# Patient Record
Sex: Male | Born: 1955 | Race: Black or African American | Hispanic: No | Marital: Single | Smoking: Current some day smoker
Health system: Southern US, Community
[De-identification: ages and names within clinical notes are randomized; demographics above are authoritative.]

## PROBLEM LIST (undated history)

## (undated) DIAGNOSIS — M1990 Unspecified osteoarthritis, unspecified site: Secondary | ICD-10-CM

## (undated) DIAGNOSIS — I10 Essential (primary) hypertension: Secondary | ICD-10-CM

## (undated) DIAGNOSIS — E119 Type 2 diabetes mellitus without complications: Secondary | ICD-10-CM

## (undated) DIAGNOSIS — R011 Cardiac murmur, unspecified: Secondary | ICD-10-CM

## (undated) DIAGNOSIS — G4730 Sleep apnea, unspecified: Secondary | ICD-10-CM

## (undated) DIAGNOSIS — R040 Epistaxis: Secondary | ICD-10-CM

## (undated) HISTORY — PX: NO PAST SURGERIES: SHX2092

---

## 2012-06-25 ENCOUNTER — Ambulatory Visit (HOSPITAL_BASED_OUTPATIENT_CLINIC_OR_DEPARTMENT_OTHER): Payer: Medicare Other | Attending: Internal Medicine | Admitting: Radiology

## 2012-06-25 VITALS — Ht 69.0 in | Wt 203.0 lb

## 2012-06-25 DIAGNOSIS — G4733 Obstructive sleep apnea (adult) (pediatric): Secondary | ICD-10-CM | POA: Insufficient documentation

## 2012-06-28 DIAGNOSIS — R0989 Other specified symptoms and signs involving the circulatory and respiratory systems: Secondary | ICD-10-CM

## 2012-06-28 DIAGNOSIS — R0609 Other forms of dyspnea: Secondary | ICD-10-CM

## 2012-06-28 DIAGNOSIS — G4733 Obstructive sleep apnea (adult) (pediatric): Secondary | ICD-10-CM

## 2012-06-29 NOTE — Procedures (Signed)
NAME:  Donald Wright, Donald Wright NO.:  [id number]  MEDICAL RECORD NO.:  [id number]          PATIENT TYPE:  OUT  LOCATION:  SLEEP CENTER                 FACILITY:  [facility]  PHYSICIAN:  Tanaya Dunigan D. Maple Hudson, MD, FCCP, FACPDATE OF BIRTH:  03[birth year]03[birth year]1957  DATE OF STUDY:  06/25/2012                           NOCTURNAL POLYSOMNOGRAM  REFERRING PHYSICIAN:  Fleet Contras, M.D.  INDICATION FOR STUDY:  Hypersomnia with sleep apnea.  EPWORTH SLEEPINESS SCORE:  16/24.  BMI 30, weight 203 pounds, height 69 inches, neck 17 inches.  MEDICATIONS:  Home medications are charted and reviewed.  SLEEP ARCHITECTURE:  Split study protocol.  During the diagnostic phase, total sleep time 120.5 minutes with sleep efficiency 82.8%.  Stage I was 5%, stage II 93.8%, stage III absent, REM 1.2% of total sleep time. Sleep latency 12 minutes, REM latency 78.5 minutes, awake after sleep onset 13 minutes.  Arousal index 35.4.  BEDTIME MEDICATION:  None.  RESPIRATORY DATA:  Apnea-hypopnea index (AHI) 42.8 per hour wore.  A total of 86 events was scored including 65 obstructive apneas, 2 central apneas, 3 mixed apneas, 16 hypopneas.  Events were not positional.  REM AHI 120 per hour.  CPAP was then titrated to 20 CWP, AHI 0 per hour.  He wore a medium ResMed Quattro FX full-face mask with heated humidifier.  OXYGEN DATA:  Moderately loud snoring with oxygen desaturation to a nadir of 82% and mean oxygen saturation through the study of 92.9% on room air.  CARDIAC DATA:  Sinus rhythm with occasional PVC.  MOVEMENT-PARASOMNIA:  Limb jerks were noted with relatively little impact on sleep.  Bathroom x2.  IMPRESSIONS-RECOMMENDATIONS: 1. Severe obstructive sleep apnea/hypopnea syndrome, AHI 42.8 per hour     with non-positional events, moderately loud snoring and oxygen     desaturation to a nadir of 82% on room air. 2. Successful continuous positive airway pressure titration to 20 CWP,     AHI 0 per hour.  He  wore a medium ResMed Quattro FX full-face mask     with heated humidifier.  Snoring was prevented and mean oxygen     saturation held 96.3% on room air.  Pressures in this range may not be     comfortable with continuous positive     airway pressure, depending on the individual.  It may be necessary     to switch to bilevel (BiPAP) in discussion with the home care     company or consultation if necessary.     Tamerra Merkley D. Maple Hudson, MD, [facility], FACP Diplomate, American Board of Sleep Medicine    CDY/MEDQ  D:  06/28/2012 09:41:00  T:  06/29/2012 04:22:58  Job:  [id number]

## 2019-07-04 ENCOUNTER — Emergency Department (HOSPITAL_COMMUNITY)
Admission: EM | Admit: 2019-07-04 | Discharge: 2019-07-04 | Disposition: A | Discharge status: Home / Self Care | Payer: Medicare Other | Attending: Emergency Medicine | Admitting: Emergency Medicine

## 2019-07-04 ENCOUNTER — Encounter (HOSPITAL_COMMUNITY): Payer: Self-pay | Admitting: Emergency Medicine

## 2019-07-04 ENCOUNTER — Other Ambulatory Visit: Payer: Self-pay

## 2019-07-04 DIAGNOSIS — Z5321 Procedure and treatment not carried out due to patient leaving prior to being seen by health care provider: Secondary | ICD-10-CM | POA: Insufficient documentation

## 2019-07-04 DIAGNOSIS — R109 Unspecified abdominal pain: Secondary | ICD-10-CM | POA: Diagnosis present

## 2019-07-04 HISTORY — DX: Type 2 diabetes mellitus without complications: E11.9

## 2019-07-04 LAB — CBC WITH DIFFERENTIAL/PLATELET
Abs Immature Granulocytes: 0.02 10*3/uL (ref 0.00–0.07)
Basophils Absolute: 0 10*3/uL (ref 0.0–0.1)
Basophils Relative: 0 %
Eosinophils Absolute: 0.2 10*3/uL (ref 0.0–0.5)
Eosinophils Relative: 3 %
HCT: 44.1 % (ref 39.0–52.0)
Hemoglobin: 15 g/dL (ref 13.0–17.0)
Immature Granulocytes: 0 %
Lymphocytes Relative: 33 %
Lymphs Abs: 2.3 10*3/uL (ref 0.7–4.0)
MCH: 30.1 pg (ref 26.0–34.0)
MCHC: 34 g/dL (ref 30.0–36.0)
MCV: 88.4 fL (ref 80.0–100.0)
Monocytes Absolute: 0.5 10*3/uL (ref 0.1–1.0)
Monocytes Relative: 7 %
Neutro Abs: 3.9 10*3/uL (ref 1.7–7.7)
Neutrophils Relative %: 57 %
Platelets: 262 10*3/uL (ref 150–400)
RBC: 4.99 MIL/uL (ref 4.22–5.81)
RDW: 12.4 % (ref 11.5–15.5)
WBC: 6.9 10*3/uL (ref 4.0–10.5)
nRBC: 0 % (ref 0.0–0.2)

## 2019-07-04 LAB — COMPREHENSIVE METABOLIC PANEL
ALT: 25 U/L (ref 0–44)
AST: 24 U/L (ref 15–41)
Albumin: 4 g/dL (ref 3.5–5.0)
Alkaline Phosphatase: 47 U/L (ref 38–126)
Anion gap: 13 (ref 5–15)
BUN: 7 mg/dL — ABNORMAL LOW (ref 8–23)
CO2: 22 mmol/L (ref 22–32)
Calcium: 9.2 mg/dL (ref 8.9–10.3)
Chloride: 104 mmol/L (ref 98–111)
Creatinine, Ser: 1.02 mg/dL (ref 0.61–1.24)
GFR calc Af Amer: >60 mL/min (ref >60)
GFR calc non Af Amer: >60 mL/min (ref >60)
Glucose, Bld: 177 mg/dL — ABNORMAL HIGH (ref 70–99)
Potassium: 3.4 mmol/L — ABNORMAL LOW (ref 3.5–5.1)
Sodium: 139 mmol/L (ref 135–145)
Total Bilirubin: 1.1 mg/dL (ref 0.3–1.2)
Total Protein: 7.3 g/dL (ref 6.5–8.1)

## 2019-07-04 NOTE — ED Notes (Signed)
No answer in waiting room for treatment room. 

## 2019-07-04 NOTE — ED Notes (Signed)
No answer in waiting room 

## 2019-07-04 NOTE — ED Notes (Signed)
No answer x 3 in [facility]

## 2019-07-04 NOTE — ED Triage Notes (Signed)
Pt to ED with c/o right groin pain for over a week.  Pt st's he was seen by his MD last Monday and was put on Cipro. Pt st's the pain has gotten worse.

## 2019-07-05 ENCOUNTER — Emergency Department (HOSPITAL_COMMUNITY)
Admission: EM | Admit: 2019-07-05 | Discharge: 2019-07-05 | Disposition: A | Discharge status: Home / Self Care | Payer: Medicare Other | Attending: Emergency Medicine | Admitting: Emergency Medicine

## 2019-07-05 ENCOUNTER — Encounter (HOSPITAL_COMMUNITY): Payer: Self-pay | Admitting: Emergency Medicine

## 2019-07-05 DIAGNOSIS — R1030 Lower abdominal pain, unspecified: Secondary | ICD-10-CM | POA: Diagnosis present

## 2019-07-05 DIAGNOSIS — Z5321 Procedure and treatment not carried out due to patient leaving prior to being seen by health care provider: Secondary | ICD-10-CM | POA: Insufficient documentation

## 2019-07-05 LAB — CBC WITH DIFFERENTIAL/PLATELET
Abs Immature Granulocytes: 0.02 10*3/uL (ref 0.00–0.07)
Basophils Absolute: 0 10*3/uL (ref 0.0–0.1)
Basophils Relative: 1 %
Eosinophils Absolute: 0.2 10*3/uL (ref 0.0–0.5)
Eosinophils Relative: 3 %
HCT: 43.2 % (ref 39.0–52.0)
Hemoglobin: 14.8 g/dL (ref 13.0–17.0)
Immature Granulocytes: 0 %
Lymphocytes Relative: 31 %
Lymphs Abs: 1.8 10*3/uL (ref 0.7–4.0)
MCH: 30.3 pg (ref 26.0–34.0)
MCHC: 34.3 g/dL (ref 30.0–36.0)
MCV: 88.5 fL (ref 80.0–100.0)
Monocytes Absolute: 0.4 10*3/uL (ref 0.1–1.0)
Monocytes Relative: 7 %
Neutro Abs: 3.6 10*3/uL (ref 1.7–7.7)
Neutrophils Relative %: 58 %
Platelets: 246 10*3/uL (ref 150–400)
RBC: 4.88 MIL/uL (ref 4.22–5.81)
RDW: 12.4 % (ref 11.5–15.5)
WBC: 6 10*3/uL (ref 4.0–10.5)
nRBC: 0 % (ref 0.0–0.2)

## 2019-07-05 LAB — URINALYSIS, ROUTINE W REFLEX MICROSCOPIC
Bacteria, UA: NONE SEEN
Bilirubin Urine: NEGATIVE
Glucose, UA: >=500 mg/dL — AB
Hgb urine dipstick: NEGATIVE
Ketones, ur: NEGATIVE mg/dL
Leukocytes,Ua: NEGATIVE
Nitrite: NEGATIVE
Protein, ur: NEGATIVE mg/dL
Specific Gravity, Urine: 1.013 (ref 1.005–1.030)
pH: 5 (ref 5.0–8.0)

## 2019-07-05 LAB — COMPREHENSIVE METABOLIC PANEL
ALT: 27 U/L (ref 0–44)
AST: 24 U/L (ref 15–41)
Albumin: 4 g/dL (ref 3.5–5.0)
Alkaline Phosphatase: 53 U/L (ref 38–126)
Anion gap: 10 (ref 5–15)
BUN: 5 mg/dL — ABNORMAL LOW (ref 8–23)
CO2: 25 mmol/L (ref 22–32)
Calcium: 9 mg/dL (ref 8.9–10.3)
Chloride: 102 mmol/L (ref 98–111)
Creatinine, Ser: 1 mg/dL (ref 0.61–1.24)
GFR calc Af Amer: >60 mL/min (ref >60)
GFR calc non Af Amer: >60 mL/min (ref >60)
Glucose, Bld: 248 mg/dL — ABNORMAL HIGH (ref 70–99)
Potassium: 3.4 mmol/L — ABNORMAL LOW (ref 3.5–5.1)
Sodium: 137 mmol/L (ref 135–145)
Total Bilirubin: 1 mg/dL (ref 0.3–1.2)
Total Protein: 7.5 g/dL (ref 6.5–8.1)

## 2019-07-05 LAB — PROTIME-INR
INR: 1 (ref 0.8–1.2)
Prothrombin Time: 13.1 seconds (ref 11.4–15.2)

## 2019-07-05 LAB — LIPASE, BLOOD: Lipase: 27 U/L (ref 11–51)

## 2019-07-05 LAB — LACTIC ACID, PLASMA: Lactic Acid, Venous: 2.5 mmol/L (ref 0.5–1.9)

## 2019-07-05 NOTE — ED Triage Notes (Signed)
Pt has 10/10 pain in right groin and his provider is  Concerned for incarcerated hernia. MD pfeiffer has spoken with dr.lamb and ordered labs- will in form charge on pt needing room.

## 2019-07-05 NOTE — ED Provider Notes (Addendum)
Dr. Arline Asp has called the emergency department to notify he is sending patient over for evaluation of possible incarcerated hernia on the right.  Will order initial lab work for evaluation by other provider.   Charlesetta Shanks, MD 07/05/19 1519    Charlesetta Shanks, MD 07/05/19 1520

## 2019-07-05 NOTE — ED Notes (Signed)
Dr Regenia Skeeter notified of lactic of 2.5

## 2019-07-05 NOTE — ED Notes (Signed)
No answer for room x3 

## 2019-07-07 ENCOUNTER — Ambulatory Visit: Payer: Self-pay | Admitting: Surgery

## 2019-07-07 NOTE — H&P (View-Only) (Signed)
Surgical H&P  CC: right groin pain  HPI: This is a very nice 63 year old man with no prior surgical history who has been experiencing severe right groin pain for about 2 weeks.  The pain is burning and sharp and radiates into the medial thigh and groin crease.  He denies any associated nausea, bloating, vomiting or change in bowel habits.  Denies any change in bladder function.  He does notice some swelling in the right groin as well.  He was evaluated by his primary care doctor who diagnosed a hernia and referred him here.  He was initially referred to the emergency room for urgent evaluation, but has gone twice now and do the long wait times has had to leave before being seen.  He does smoke on occasion, in fact had quit briefly started again to help himself get to this pain.  No Known Allergies  Past Medical History:  Diagnosis Date  . Diabetes mellitus without complication ([place])     No past surgical history on file.  No family history on file.  Social History   Socioeconomic History  . Marital status: Single    Spouse name: Not on file  . Number of children: Not on file  . Years of education: Not on file  . Highest education level: Not on file  Occupational History  . Not on file  Social Needs  . Financial resource strain: Not on file  . Food insecurity    Worry: Not on file    Inability: Not on file  . Transportation needs    Medical: Not on file    Non-medical: Not on file  Tobacco Use  . Smoking status: Current Some Day Smoker  . Smokeless tobacco: Never Used  Substance and Sexual Activity  . Alcohol use: Not Currently  . Drug use: Never  . Sexual activity: Not on file  Lifestyle  . Physical activity    Days per week: Not on file    Minutes per session: Not on file  . Stress: Not on file  Relationships  . Social Herbalist on phone: Not on file    Gets together: Not on file    Attends religious service: Not on file    Active member of club or  organization: Not on file    Attends meetings of clubs or organizations: Not on file    Relationship status: Not on file  Other Topics Concern  . Not on file  Social History Narrative  . Not on file    No current outpatient medications on file prior to visit.   No current facility-administered medications on file prior to visit.     Review of Systems: a complete, 10pt review of systems was completed with pertinent positives and negatives as documented in the HPI  Physical Exam: Vitals ([place]; 07/07/2019 11:06 AM) 07/07/2019 11:05 AM Weight: 196.6 lb Height: 69in Body Surface Area: 2.05 m Body Mass Index: 29.03 kg/m  Temp.: 97.96F(Temporal)  Pulse: 97 (Regular)  BP: 154/88 (Sitting, Left Arm, Standard)  Gen: alert and well appearing Eye: extraocular motion intact, no scleral icterus ENT: moist mucus membranes, dentition intact Neck: no mass or thyromegaly Chest: unlabored respirations, symmetrical air entry, clear bilaterally CV: regular rate and rhythm, no pedal edema Abdomen: soft, nontender, nondistended. No mass or organomegaly. Reducible right inguinal hernia is present with Valsalva. Tender along the inguinal canal. There is laxity on the left side but no overt hernia on exam. MSK: strength  symmetrical throughout, no deformity Neuro: grossly intact, normal gait Psych: normal mood and affect, appropriate insight Skin: warm and dry, no rash or lesion on limited exam   CBC Latest Ref Rng & Units 07/05/2019 07/04/2019  WBC 4.0 - 10.5 K/uL 6.0 6.9  Hemoglobin 13.0 - 17.0 g/dL 14.8 15.0  Hematocrit 39.0 - 52.0 % 43.2 44.1  Platelets 150 - 400 K/uL 246 262    CMP Latest Ref Rng & Units 07/05/2019 07/04/2019  Glucose 70 - 99 mg/dL 248(H) 177(H)  BUN 8 - 23 mg/dL 5(L) 7(L)  Creatinine 0.61 - 1.24 mg/dL 1.00 1.02  Sodium 135 - 145 mmol/L 137 139  Potassium 3.5 - 5.1 mmol/L 3.4(L) 3.4(L)  Chloride 98 - 111 mmol/L 102 104  CO2 22 - 32 mmol/L 25 22   Calcium 8.9 - 10.3 mg/dL 9.0 9.2  Total Protein 6.5 - 8.1 g/dL 7.5 7.3  Total Bilirubin 0.3 - 1.2 mg/dL 1.0 1.1  Alkaline Phos 38 - 126 U/L 53 47  AST 15 - 41 U/L 24 24  ALT 0 - 44 U/L 27 25    Lab Results  Component Value Date   INR 1.0 07/05/2019    Imaging: No results found.   A/P: INGUINAL HERNIA, RIGHT (K40.90) Story: Causing significant pain. I recommend proceeding with open right inguinal hernia repair with mesh. We discussed the procedure in detail in the typical postoperative course. Discussed risks of bleeding, infection, pain, scarring, injury to structures in the field including blood vessels, bowel, bladder and nerves as well as a 1% risk of chronic inguinodynia. Discussed risk of hernia recurrence which is increased by the use of nicotine. Advised him to quit smoking. He says that this will be no problem. Questions were answered. We will try to get him scheduled for next week.   Romana Juniper, MD [facility], [place] Pager [phone]

## 2019-07-07 NOTE — H&P (Signed)
Surgical H&P  CC: right groin pain  HPI: This is a very nice 63 year old man with no prior surgical history who has been experiencing severe right groin pain for about 2 weeks.  The pain is burning and sharp and radiates into the medial thigh and groin crease.  He denies any associated nausea, bloating, vomiting or change in bowel habits.  Denies any change in bladder function.  He does notice some swelling in the right groin as well.  He was evaluated by his primary care doctor who diagnosed a hernia and referred him here.  He was initially referred to the emergency room for urgent evaluation, but has gone twice now and do the long wait times has had to leave before being seen.  He does smoke on occasion, in fact had quit briefly started again to help himself get to this pain.  No Known Allergies  Past Medical History:  Diagnosis Date  . Diabetes mellitus without complication ([place])     No past surgical history on file.  No family history on file.  Social History   Socioeconomic History  . Marital status: Single    Spouse name: Not on file  . Number of children: Not on file  . Years of education: Not on file  . Highest education level: Not on file  Occupational History  . Not on file  Social Needs  . Financial resource strain: Not on file  . Food insecurity    Worry: Not on file    Inability: Not on file  . Transportation needs    Medical: Not on file    Non-medical: Not on file  Tobacco Use  . Smoking status: Current Some Day Smoker  . Smokeless tobacco: Never Used  Substance and Sexual Activity  . Alcohol use: Not Currently  . Drug use: Never  . Sexual activity: Not on file  Lifestyle  . Physical activity    Days per week: Not on file    Minutes per session: Not on file  . Stress: Not on file  Relationships  . Social Herbalist on phone: Not on file    Gets together: Not on file    Attends religious service: Not on file    Active member of club or  organization: Not on file    Attends meetings of clubs or organizations: Not on file    Relationship status: Not on file  Other Topics Concern  . Not on file  Social History Narrative  . Not on file    No current outpatient medications on file prior to visit.   No current facility-administered medications on file prior to visit.     Review of Systems: a complete, 10pt review of systems was completed with pertinent positives and negatives as documented in the HPI  Physical Exam: Vitals ([place]; 07/07/2019 11:06 AM) 07/07/2019 11:05 AM Weight: 196.6 lb Height: 69in Body Surface Area: 2.05 m Body Mass Index: 29.03 kg/m  Temp.: 97.20F(Temporal)  Pulse: 97 (Regular)  BP: 154/88 (Sitting, Left Arm, Standard)  Gen: alert and well appearing Eye: extraocular motion intact, no scleral icterus ENT: moist mucus membranes, dentition intact Neck: no mass or thyromegaly Chest: unlabored respirations, symmetrical air entry, clear bilaterally CV: regular rate and rhythm, no pedal edema Abdomen: soft, nontender, nondistended. No mass or organomegaly. Reducible right inguinal hernia is present with Valsalva. Tender along the inguinal canal. There is laxity on the left side but no overt hernia on exam. MSK: strength  symmetrical throughout, no deformity Neuro: grossly intact, normal gait Psych: normal mood and affect, appropriate insight Skin: warm and dry, no rash or lesion on limited exam   CBC Latest Ref Rng & Units 07/05/2019 07/04/2019  WBC 4.0 - 10.5 K/uL 6.0 6.9  Hemoglobin 13.0 - 17.0 g/dL 14.8 15.0  Hematocrit 39.0 - 52.0 % 43.2 44.1  Platelets 150 - 400 K/uL 246 262    CMP Latest Ref Rng & Units 07/05/2019 07/04/2019  Glucose 70 - 99 mg/dL 248(H) 177(H)  BUN 8 - 23 mg/dL 5(L) 7(L)  Creatinine 0.61 - 1.24 mg/dL 1.00 1.02  Sodium 135 - 145 mmol/L 137 139  Potassium 3.5 - 5.1 mmol/L 3.4(L) 3.4(L)  Chloride 98 - 111 mmol/L 102 104  CO2 22 - 32 mmol/L 25 22   Calcium 8.9 - 10.3 mg/dL 9.0 9.2  Total Protein 6.5 - 8.1 g/dL 7.5 7.3  Total Bilirubin 0.3 - 1.2 mg/dL 1.0 1.1  Alkaline Phos 38 - 126 U/L 53 47  AST 15 - 41 U/L 24 24  ALT 0 - 44 U/L 27 25    Lab Results  Component Value Date   INR 1.0 07/05/2019    Imaging: No results found.   A/P: INGUINAL HERNIA, RIGHT (K40.90) Story: Causing significant pain. I recommend proceeding with open right inguinal hernia repair with mesh. We discussed the procedure in detail in the typical postoperative course. Discussed risks of bleeding, infection, pain, scarring, injury to structures in the field including blood vessels, bowel, bladder and nerves as well as a 1% risk of chronic inguinodynia. Discussed risk of hernia recurrence which is increased by the use of nicotine. Advised him to quit smoking. He says that this will be no problem. Questions were answered. We will try to get him scheduled for next week.   Romana Juniper, MD [facility] Surgery, [place] Pager [phone]

## 2019-07-12 NOTE — Progress Notes (Signed)
[facility] [place], [place] AT [address] [place] [address] Phone: [phone] Fax: [phone]      Your procedure is scheduled on August 28  Report to [facility] Main Entrance "A" at 0530 A.M., and check in at the Admitting office.  Call this number if you have problems the morning of surgery:  [phone]  Call [phone] if you have any questions prior to your surgery date Monday-Friday 8am-4pm    Remember:  Do not eat or drink after midnight the night before your surgery    Take these medicines the morning of surgery with A SIP OF WATER  HYDROcodone-acetaminophen ([place]) if needed for pain metoprolol succinate (TOPROL-XL)  7 days prior to surgery STOP taking any Aspirin (unless otherwise instructed by your surgeon), Aleve, Naproxen, Ibuprofen, Motrin, Advil, Goody's, BC's, all herbal medications, fish oil, and all vitamins.   WHAT DO I DO ABOUT MY DIABETES MEDICATION?   Mar[place] Kitchen Do not take oral diabetes medicines (pills) the morning of surgery. metFORMIN (GLUCOPHAGE)  How to Manage Your Diabetes Before and After Surgery  Why is it important to control my blood sugar before and after surgery? . Improving blood sugar levels before and after surgery helps healing and can limit problems. . A way of improving blood sugar control is eating a healthy diet by: o  Eating less sugar and carbohydrates o  Increasing activity/exercise o  Talking with your doctor about reaching your blood sugar goals . High blood sugars (greater than 180 mg/dL) can raise your risk of infections and slow your recovery, so you will need to focus on controlling your diabetes during the weeks before surgery. . Make sure that the doctor who takes care of your diabetes knows about your planned surgery including the date and location.  How do I manage my blood sugar before surgery? . Check your blood sugar at least 4  times a day, starting 2 days before surgery, to make sure that the level is not too high or low. o Check your blood sugar the morning of your surgery when you wake up and every 2 hours until you get to the Short Stay unit. . If your blood sugar is less than 70 mg/dL, you will need to treat for low blood sugar: o Do not take insulin. o Treat a low blood sugar (less than 70 mg/dL) with  cup of clear juice (cranberry or apple), 4 glucose tablets, OR glucose gel. o Recheck blood sugar in 15 minutes after treatment (to make sure it is greater than 70 mg/dL). If your blood sugar is not greater than 70 mg/dL on recheck, call [phone] for further instructions. . Report your blood sugar to the short stay nurse when you get to Short Stay.  . If you are admitted to the hospital after surgery: o Your blood sugar will be checked by the staff and you will probably be given insulin after surgery (instead of oral diabetes medicines) to make sure you have good blood sugar levels. o The goal for blood sugar control after surgery is 80-180 mg/dL   The Morning of Surgery  Do not wear jewelry  Do not wear lotions, powders, or colognes, or deodorant  Men may shave face and neck.  Do not bring valuables to the hospital.  [facility] is not responsible for any belongings or valuables.  If you are a smoker, DO NOT Smoke 24 hours prior to  surgery IF you wear a CPAP at night please bring your mask, tubing, and machine the morning of surgery   Remember that you must have someone to transport you home after your surgery, and remain with you for 24 hours if you are discharged the same day.   Contacts, glasses, hearing aids, dentures or bridgework may not be worn into surgery.    Leave your suitcase in the car.  After surgery it may be brought to your room.  For patients admitted to the hospital, discharge time will be determined by your treatment team.  Patients discharged the day of surgery will not be  allowed to drive home.    Special instructions:   [facility]- Preparing For Surgery  Before surgery, you can play an important role. Because skin is not sterile, your skin needs to be as free of germs as possible. You can reduce the number of germs on your skin by washing with CHG (chlorahexidine gluconate) Soap before surgery.  CHG is an antiseptic cleaner which kills germs and bonds with the skin to continue killing germs even after washing.    Oral Hygiene is also important to reduce your risk of infection.  Remember - BRUSH YOUR TEETH THE MORNING OF SURGERY WITH YOUR REGULAR TOOTHPASTE  Please do not use if you have an allergy to CHG or antibacterial soaps. If your skin becomes reddened/irritated stop using the CHG.  Do not shave (including legs and underarms) for at least 48 hours prior to first CHG shower. It is OK to shave your face.  Please follow these instructions carefully.   1. Shower the NIGHT BEFORE SURGERY and the MORNING OF SURGERY with CHG Soap.   2. If you chose to wash your hair, wash your hair first as usual with your normal shampoo.  3. After you shampoo, rinse your hair and body thoroughly to remove the shampoo.  4. Use CHG as you would any other liquid soap. You can apply CHG directly to the skin and wash gently with a scrungie or a clean washcloth.   5. Apply the CHG Soap to your body ONLY FROM THE NECK DOWN.  Do not use on open wounds or open sores. Avoid contact with your eyes, ears, mouth and genitals (private parts). Wash Face and genitals (private parts)  with your normal soap.   6. Wash thoroughly, paying special attention to the area where your surgery will be performed.  7. Thoroughly rinse your body with warm water from the neck down.  8. DO NOT shower/wash with your normal soap after using and rinsing off the CHG Soap.  9. Pat yourself dry with a CLEAN TOWEL.  10. Wear CLEAN PAJAMAS to bed the night before surgery, wear comfortable clothes the  morning of surgery  11. Place CLEAN SHEETS on your bed the night of your first shower and DO NOT SLEEP WITH PETS.    Day of Surgery:  Do not apply any deodorants/lotions. Please shower the morning of surgery with the CHG soap  Please wear clean clothes to the hospital/surgery center.   Remember to brush your teeth WITH YOUR REGULAR TOOTHPASTE.   Please read over the following fact sheets that you were given.

## 2019-07-13 ENCOUNTER — Encounter (HOSPITAL_COMMUNITY)
Admission: RE | Admit: 2019-07-13 | Discharge: 2019-07-13 | Disposition: A | Discharge status: Home / Self Care | Payer: Medicare Other | Source: Ambulatory Visit | Attending: Surgery | Admitting: Surgery

## 2019-07-13 ENCOUNTER — Other Ambulatory Visit (HOSPITAL_COMMUNITY)
Admission: RE | Admit: 2019-07-13 | Discharge: 2019-07-13 | Disposition: A | Discharge status: Home / Self Care | Payer: Medicare Other | Source: Ambulatory Visit | Attending: Surgery | Admitting: Surgery

## 2019-07-13 ENCOUNTER — Other Ambulatory Visit: Payer: Self-pay

## 2019-07-13 ENCOUNTER — Other Ambulatory Visit (HOSPITAL_COMMUNITY): Payer: Medicare Other

## 2019-07-13 ENCOUNTER — Encounter (HOSPITAL_COMMUNITY): Payer: Self-pay

## 2019-07-13 DIAGNOSIS — Z1159 Encounter for screening for other viral diseases: Secondary | ICD-10-CM | POA: Insufficient documentation

## 2019-07-13 DIAGNOSIS — E119 Type 2 diabetes mellitus without complications: Secondary | ICD-10-CM | POA: Insufficient documentation

## 2019-07-13 DIAGNOSIS — F17210 Nicotine dependence, cigarettes, uncomplicated: Secondary | ICD-10-CM | POA: Insufficient documentation

## 2019-07-13 DIAGNOSIS — Z01818 Encounter for other preprocedural examination: Secondary | ICD-10-CM | POA: Insufficient documentation

## 2019-07-13 DIAGNOSIS — K4090 Unilateral inguinal hernia, without obstruction or gangrene, not specified as recurrent: Secondary | ICD-10-CM | POA: Insufficient documentation

## 2019-07-13 HISTORY — DX: Epistaxis: R04.0

## 2019-07-13 HISTORY — DX: Essential (primary) hypertension: I10

## 2019-07-13 HISTORY — DX: Unspecified osteoarthritis, unspecified site: M19.90

## 2019-07-13 HISTORY — DX: Cardiac murmur, unspecified: R01.1

## 2019-07-13 HISTORY — DX: Sleep apnea, unspecified: G47.30

## 2019-07-13 LAB — HEMOGLOBIN A1C
Hgb A1c MFr Bld: 8.9 % — ABNORMAL HIGH (ref 4.8–5.6)
Mean Plasma Glucose: 208.73 mg/dL

## 2019-07-13 LAB — SARS CORONAVIRUS 2 (TAT 6-24 HRS): SARS Coronavirus 2: NEGATIVE

## 2019-07-13 LAB — GLUCOSE, CAPILLARY: Glucose-Capillary: 160 mg/dL — ABNORMAL HIGH (ref 70–99)

## 2019-07-13 NOTE — Progress Notes (Signed)
PCP - A. Lamb Cardiologist - several yrs. Ago in [place], told that the chest pain that he was having was "bone pain", unsure of the name of the cardiologist  Chest x-ray - n/a EKG - today Stress Test - pt. Reports that he had a nuclear stress test (5 yrs. Ago or more)  & was unsure of the results. ECHO - n/a Cardiac Cath - n/a  Sleep Study - last study done in [place] CPAP - pt. States he asked for new hose & mask & they never brought it to him so he asked them to remove the CPAP machine from him home, Pt. Has not used CPAP since moving to [place] in 11/2018  Fasting Blood Sugar -160  Checks Blood Sugar ___0__ times a day, recently reports that he wsa started on Metformin, does not have CBG metere Blood Thinner Instructions:n/a Aspirin Instructions:n/a cautioned against all blood thinners Anesthesia review: pending labs- HGBA1C  Patient denies shortness of breath, fever, cough and chest pain at PAT appointment   Patient verbalized understanding of instructions that were given to them at the PAT appointment. Patient was also instructed that they will need to review over the PAT instructions again at home before surgery.

## 2019-07-13 NOTE — Progress Notes (Signed)
   07/13/19 1122  OBSTRUCTIVE SLEEP APNEA  Have you ever been diagnosed with sleep apnea through a sleep study? Yes  If yes, do you have and use a CPAP or BPAP machine every night? 0  Do you snore loudly (loud enough to be heard through closed doors)?  1  Do you often feel tired, fatigued, or sleepy during the daytime (such as falling asleep during driving or talking to someone)? 1  Has anyone observed you stop breathing during your sleep? 1  Do you have, or are you being treated for high blood pressure? 1  BMI more than 35 kg/m2? 0  Age > 50 (1-yes) 1  Neck circumference greater than:Male 16 inches or larger, Male 17inches or larger? 0  Male Gender (Yes=1) 1  Obstructive Sleep Apnea Score 6  Score 5 or greater  Results sent to PCP

## 2019-07-14 NOTE — Progress Notes (Signed)
Anesthesia Chart Review:  Case: [id number] Date/Time: 07/16/19 0715   Procedure: OPEN RIGHT INGUINAL HERNIA REPAIR (Right )   Anesthesia type: General   Pre-op diagnosis: RIGHT INGUINAL HERNIA   Location: [facility] OR ROOM 01 / [facility]   Surgeon: Clovis Riley, MD      DISCUSSION: Patient is a 63 year old male scheduled for the above procedure. He was sent to ED for possible incarcerated right inguinal hernia on 07/05/19, but left without being seen. Lactic acid 2.5. He was then evaluated by Dr. Kae Heller on 07/07/19 and the above procedure recommended. Hernia was tender, but reducible by Valsalva at that time.   History includes smoking, DM2, HTN, left epistaxis, OSA (severe, 2013; hasn't used CPAP since moving back to [facility] 11/2018), murmur (> 10 years ago; no mention recently). He reported use of marijuana and also used "crack" cocaine a week ago. Says he does not regularly use cocaine and only used a week ago desire to self medicate for pain. He knows to refrain for upcoming surgery and UDS would be considered if any concern for acute intoxication.    He moved back to [facility] 11/2018, after being away for 8 years.   In regards to cardiac status, he reports at least 2 prior stress tests for what sound like diffuse bone pain including reproducible chest tenderness. He reports he has always been told his heart checks out okay. Last stress test probably > 5 years ago. He is not quite sure if he ever had an echo. Reports a provider mentioned a murmur > 10 years ago, but no recent mention of a murmur and has never been told he needs on-going cardiology follow-up. No exertional chest pain, SOB, orthopnea, or pedal edema. He occasionally gets right arm cramping with shaving and RLE cramps at night. Over the last few months he has not been as active, but prior to COVID-19 pandemic said he was able to walk for "miles" without difficultly. He does not check home CBGs. He was informed of A1c result of 8.9% and need for PCP  follow-up--says he has visit next month. I also updated [facility] triage nurse regarding A1c result and that patient had been advised to refrain from illicit drug use with upcoming surgery.  Anesthesia team to evaluate on the day of surgery. 07/13/19 COVID-19 test negative.     VS: BP (!) 143/84   Pulse 74   Temp 36.5 C   Resp 18   Ht 5\' 9"  (1.753 m)   SpO2 95%   BMI 28.65 kg/m   PROVIDERS: Rocco Serene, MD is PCP at [facility] [phone])   LABS: Preoperative labs noted. CBG on the day of surgery. (all labs ordered are listed, but only abnormal results are displayed)  Labs Reviewed  GLUCOSE, CAPILLARY - Abnormal; Notable for the following components:      Result Value   Glucose-Capillary 160 (*)    All other components within normal limits  HEMOGLOBIN A1C - Abnormal; Notable for the following components:   Hgb A1c MFr Bld 8.9 (*)    All other components within normal limits  CBC WITH DIFFERENTIAL/PLATELET    OTHER: - 06/29/12 sleep study showed severe OSA/hypopnea syndrome, AHI 42.8 per hour with non-positional events, moderately loud snoring and O2 desaturations to a nadir of 82% on RA.   EKG: 07/13/19:  Normal sinus rhythm Minimal voltage criteria for LVH, may be normal variant Nonspecific T wave abnormality Abnormal ECG Confirmed by Kate Sable [phone])  on 07/13/2019 1:08:29 PM   CV: Reported stress test > 5 years ago in [place], [place]. Unsure about echo. See DISCUSSION.    Past Medical History:  Diagnosis Date  . Arthritis    "groin" arthritis , back & R knee  . Bleeding from the nose    last one- 07/04/2019, pt. reports that he has been to ENT & he was told that there wasn't a problem so he said he would not pay her.   . Diabetes mellitus without complication ([place])   . Heart murmur    pt. thinks he had an ECHO many yrs. ago, doesn't have the results.   . Hypertension   . Sleep apnea    see notes under apnea     Past Surgical History:   Procedure Laterality Date  . NO PAST SURGERIES      MEDICATIONS: . cholecalciferol (VITAMIN D3) 25 MCG (1000 UT) tablet  . diphenhydramine-acetaminophen (TYLENOL PM) 25-500 MG TABS tablet  . GINSENG PO  . HYDROcodone-acetaminophen (NORCO) 10-325 MG tablet  . lisinopril (ZESTRIL) 40 MG tablet  . metFORMIN (GLUCOPHAGE) 500 MG tablet  . metoprolol succinate (TOPROL-XL) 50 MG 24 hr tablet  . vitamin E 400 UNIT capsule   No current facility-administered medications for this encounter.     Myra Gianotti, PA-C Surgical Short Stay/Anesthesiology [facility] Phone [phone] [facility] Phone [phone] 07/14/2019 4:16 PM

## 2019-07-14 NOTE — Anesthesia Preprocedure Evaluation (Addendum)
Anesthesia Evaluation  Patient identified by MRN, date of birth, ID band Patient awake    Reviewed: Allergy & Precautions, NPO status , Patient's Chart, lab work & pertinent test results  History of Anesthesia Complications Negative for: history of anesthetic complications  Airway Mallampati: II  TM Distance: >3 FB Neck ROM: Full    Dental no notable dental hx. (+) Edentulous Upper, Poor Dentition,    Pulmonary neg pulmonary ROS, sleep apnea , Current Smoker and Patient abstained from smoking.,    Pulmonary exam normal breath sounds clear to auscultation       Cardiovascular hypertension, negative cardio ROS Normal cardiovascular exam Rhythm:Regular Rate:Normal     Neuro/Psych negative neurological ROS  negative psych ROS   GI/Hepatic negative GI ROS, Neg liver ROS, (+)     substance abuse  cocaine use and marijuana use,   Endo/Other  negative endocrine ROSdiabetes, Poorly Controlled, Type 2  Renal/GU negative Renal ROS  negative genitourinary   Musculoskeletal negative musculoskeletal ROS (+)   Abdominal   Peds negative pediatric ROS (+)  Hematology negative hematology ROS (+)   Anesthesia Other Findings   Reproductive/Obstetrics negative OB ROS                           Anesthesia Physical Anesthesia Plan  ASA: III  Anesthesia Plan: General   Post-op Pain Management:    Induction: Intravenous  PONV Risk Score and Plan: 1 and Ondansetron, Dexamethasone, Treatment may vary due to age or medical condition and Midazolam  Airway Management Planned: LMA and Oral ETT  Additional Equipment: None  Intra-op Plan:   Post-operative Plan: Extubation in OR  Informed Consent: I have reviewed the patients History and Physical, chart, labs and discussed the procedure including the risks, benefits and alternatives for the proposed anesthesia with the patient or authorized representative  who has indicated his/her understanding and acceptance.     Dental advisory given  Plan Discussed with:   Anesthesia Plan Comments: (See PAT note written 07/14/2019 by Myra Gianotti, PA-C. )    Anesthesia Quick Evaluation

## 2019-07-16 ENCOUNTER — Ambulatory Visit (HOSPITAL_COMMUNITY): Payer: Medicare Other | Admitting: Anesthesiology

## 2019-07-16 ENCOUNTER — Other Ambulatory Visit: Payer: Self-pay

## 2019-07-16 ENCOUNTER — Ambulatory Visit (HOSPITAL_COMMUNITY): Payer: Medicare Other | Admitting: Vascular Surgery

## 2019-07-16 ENCOUNTER — Encounter (HOSPITAL_COMMUNITY)
Admission: RE | Disposition: A | Discharge status: Home / Self Care | Payer: Self-pay | Source: Home / Self Care | Attending: Surgery

## 2019-07-16 ENCOUNTER — Encounter (HOSPITAL_COMMUNITY): Payer: Self-pay | Admitting: General Practice

## 2019-07-16 ENCOUNTER — Ambulatory Visit (HOSPITAL_COMMUNITY)
Admission: RE | Admit: 2019-07-16 | Discharge: 2019-07-16 | Disposition: A | Discharge status: Home / Self Care | Payer: Medicare Other | Attending: Surgery | Admitting: Surgery

## 2019-07-16 DIAGNOSIS — G4730 Sleep apnea, unspecified: Secondary | ICD-10-CM | POA: Insufficient documentation

## 2019-07-16 DIAGNOSIS — I10 Essential (primary) hypertension: Secondary | ICD-10-CM | POA: Insufficient documentation

## 2019-07-16 DIAGNOSIS — K4090 Unilateral inguinal hernia, without obstruction or gangrene, not specified as recurrent: Secondary | ICD-10-CM | POA: Diagnosis present

## 2019-07-16 DIAGNOSIS — F17200 Nicotine dependence, unspecified, uncomplicated: Secondary | ICD-10-CM | POA: Diagnosis not present

## 2019-07-16 DIAGNOSIS — E119 Type 2 diabetes mellitus without complications: Secondary | ICD-10-CM | POA: Diagnosis not present

## 2019-07-16 DIAGNOSIS — D176 Benign lipomatous neoplasm of spermatic cord: Secondary | ICD-10-CM | POA: Insufficient documentation

## 2019-07-16 HISTORY — PX: INGUINAL HERNIA REPAIR: SHX194

## 2019-07-16 LAB — GLUCOSE, CAPILLARY
Glucose-Capillary: 160 mg/dL — ABNORMAL HIGH (ref 70–99)
Glucose-Capillary: 155 mg/dL — ABNORMAL HIGH (ref 70–99)

## 2019-07-16 SURGERY — REPAIR, HERNIA, INGUINAL, ADULT
Anesthesia: General | Site: Inguinal | Laterality: Right

## 2019-07-16 MED ORDER — SODIUM CHLORIDE 0.9% FLUSH: 3.0000 mL | Freq: Two times a day (BID) | INTRAVENOUS | Status: DC

## 2019-07-16 MED ORDER — OXYCODONE HCL 5 MG PO TABS: 5.0000 mg | ORAL_TABLET | Freq: Once | ORAL | Status: DC | PRN

## 2019-07-16 MED ORDER — FENTANYL CITRATE (PF) 100 MCG/2ML IJ SOLN: 25.0000 ug | INTRAMUSCULAR | Status: DC | PRN

## 2019-07-16 MED ORDER — CEFAZOLIN SODIUM-DEXTROSE 2-4 GM/100ML-% IV SOLN
2.0000 g | INTRAVENOUS | Status: AC
  Administered 2019-07-16: 2 g via INTRAVENOUS
  Filled 2019-07-16: qty 100

## 2019-07-16 MED ORDER — SODIUM CHLORIDE 0.9% FLUSH: 3.0000 mL | INTRAVENOUS | Status: DC | PRN

## 2019-07-16 MED ORDER — LIDOCAINE 2% (20 MG/ML) 5 ML SYRINGE
INTRAMUSCULAR | Status: DC | PRN
  Administered 2019-07-16: 100 mg via INTRAVENOUS

## 2019-07-16 MED ORDER — EPHEDRINE SULFATE-NACL 50-0.9 MG/10ML-% IV SOSY
PREFILLED_SYRINGE | INTRAVENOUS | Status: DC | PRN
  Administered 2019-07-16 (×5): 10 mg via INTRAVENOUS

## 2019-07-16 MED ORDER — 0.9 % SODIUM CHLORIDE (POUR BTL) OPTIME
TOPICAL | Status: DC | PRN
  Administered 2019-07-16: 1000 mL

## 2019-07-16 MED ORDER — FENTANYL CITRATE (PF) 250 MCG/5ML IJ SOLN
INTRAMUSCULAR | Status: AC
  Filled 2019-07-16: qty 5

## 2019-07-16 MED ORDER — ACETAMINOPHEN 500 MG PO TABS
1000.0000 mg | ORAL_TABLET | ORAL | Status: AC
  Administered 2019-07-16: 06:00:00 1000 mg via ORAL
  Filled 2019-07-16: qty 2

## 2019-07-16 MED ORDER — PROPOFOL 10 MG/ML IV BOLUS
INTRAVENOUS | Status: DC | PRN
  Administered 2019-07-16: 130 mg via INTRAVENOUS

## 2019-07-16 MED ORDER — GLYCOPYRROLATE PF 0.2 MG/ML IJ SOSY
PREFILLED_SYRINGE | INTRAMUSCULAR | Status: DC | PRN
  Administered 2019-07-16: .2 mg via INTRAVENOUS

## 2019-07-16 MED ORDER — MIDAZOLAM HCL 2 MG/2ML IJ SOLN
INTRAMUSCULAR | Status: DC | PRN
  Administered 2019-07-16: 2 mg via INTRAVENOUS

## 2019-07-16 MED ORDER — METHOCARBAMOL 500 MG PO TABS: 500.0000 mg | ORAL_TABLET | Freq: Four times a day (QID) | ORAL | 0 refills | Status: DC | PRN

## 2019-07-16 MED ORDER — SODIUM CHLORIDE 0.9 % IV SOLN: 250.0000 mL | INTRAVENOUS | Status: DC | PRN

## 2019-07-16 MED ORDER — FENTANYL CITRATE (PF) 250 MCG/5ML IJ SOLN
INTRAMUSCULAR | Status: DC | PRN
  Administered 2019-07-16: 25 ug via INTRAVENOUS
  Administered 2019-07-16: 50 ug via INTRAVENOUS
  Administered 2019-07-16: 25 ug via INTRAVENOUS
  Administered 2019-07-16: 50 ug via INTRAVENOUS

## 2019-07-16 MED ORDER — GABAPENTIN 300 MG PO CAPS
300.0000 mg | ORAL_CAPSULE | ORAL | Status: AC
  Administered 2019-07-16: 300 mg via ORAL
  Filled 2019-07-16: qty 1

## 2019-07-16 MED ORDER — BUPIVACAINE-EPINEPHRINE (PF) 0.25% -1:200000 IJ SOLN
INTRAMUSCULAR | Status: AC
  Filled 2019-07-16: qty 30

## 2019-07-16 MED ORDER — CHLORHEXIDINE GLUCONATE 4 % EX LIQD: 60.0000 mL | Freq: Once | CUTANEOUS | Status: DC

## 2019-07-16 MED ORDER — OXYCODONE HCL 5 MG/5ML PO SOLN: 5.0000 mg | Freq: Once | ORAL | Status: DC | PRN

## 2019-07-16 MED ORDER — OXYCODONE HCL 5 MG PO TABS: 5.0000 mg | ORAL_TABLET | ORAL | Status: DC | PRN

## 2019-07-16 MED ORDER — ACETAMINOPHEN 325 MG PO TABS: 650.0000 mg | ORAL_TABLET | ORAL | Status: DC | PRN

## 2019-07-16 MED ORDER — PROPOFOL 10 MG/ML IV BOLUS
INTRAVENOUS | Status: AC
  Filled 2019-07-16: qty 20

## 2019-07-16 MED ORDER — PHENYLEPHRINE 40 MCG/ML (10ML) SYRINGE FOR IV PUSH (FOR BLOOD PRESSURE SUPPORT)
PREFILLED_SYRINGE | INTRAVENOUS | Status: DC | PRN
  Administered 2019-07-16: 80 ug via INTRAVENOUS
  Administered 2019-07-16: 40 ug via INTRAVENOUS
  Administered 2019-07-16 (×2): 80 ug via INTRAVENOUS
  Administered 2019-07-16 (×2): 40 ug via INTRAVENOUS

## 2019-07-16 MED ORDER — ONDANSETRON HCL 4 MG/2ML IJ SOLN
INTRAMUSCULAR | Status: DC | PRN
  Administered 2019-07-16: 4 mg via INTRAVENOUS

## 2019-07-16 MED ORDER — BUPIVACAINE LIPOSOME 1.3 % IJ SUSP
INTRAMUSCULAR | Status: DC | PRN
  Administered 2019-07-16: 15 mL

## 2019-07-16 MED ORDER — ACETAMINOPHEN 650 MG RE SUPP: 650.0000 mg | RECTAL | Status: DC | PRN

## 2019-07-16 MED ORDER — ONDANSETRON HCL 4 MG/2ML IJ SOLN: 4.0000 mg | Freq: Once | INTRAMUSCULAR | Status: DC | PRN

## 2019-07-16 MED ORDER — DOCUSATE SODIUM 100 MG PO CAPS: 100.0000 mg | ORAL_CAPSULE | Freq: Two times a day (BID) | ORAL | 0 refills | Status: AC

## 2019-07-16 MED ORDER — FENTANYL CITRATE (PF) 100 MCG/2ML IJ SOLN
INTRAMUSCULAR | Status: AC
  Filled 2019-07-16: qty 2

## 2019-07-16 MED ORDER — GABAPENTIN 300 MG PO CAPS: 300.0000 mg | ORAL_CAPSULE | Freq: Three times a day (TID) | ORAL | 1 refills | Status: DC

## 2019-07-16 MED ORDER — BUPIVACAINE-EPINEPHRINE 0.25% -1:200000 IJ SOLN
INTRAMUSCULAR | Status: DC | PRN
  Administered 2019-07-16: 15 mL

## 2019-07-16 MED ORDER — BUPIVACAINE LIPOSOME 1.3 % IJ SUSP
20.0000 mL | Freq: Once | INTRAMUSCULAR | Status: DC
  Filled 2019-07-16 (×2): qty 20

## 2019-07-16 MED ORDER — DEXAMETHASONE SODIUM PHOSPHATE 10 MG/ML IJ SOLN
INTRAMUSCULAR | Status: DC | PRN
  Administered 2019-07-16: 4 mg via INTRAVENOUS

## 2019-07-16 MED ORDER — FENTANYL CITRATE (PF) 100 MCG/2ML IJ SOLN
25.0000 ug | INTRAMUSCULAR | Status: DC | PRN
  Administered 2019-07-16: 25 ug via INTRAVENOUS
  Administered 2019-07-16: 10:00:00 50 ug via INTRAVENOUS
  Administered 2019-07-16: 25 ug via INTRAVENOUS

## 2019-07-16 MED ORDER — LACTATED RINGERS IV SOLN
INTRAVENOUS | Status: DC | PRN
  Administered 2019-07-16 (×2): via INTRAVENOUS

## 2019-07-16 MED ORDER — MIDAZOLAM HCL 2 MG/2ML IJ SOLN
INTRAMUSCULAR | Status: AC
  Filled 2019-07-16: qty 2

## 2019-07-16 SURGICAL SUPPLY — 43 items
BENZOIN TINCTURE PRP APPL 2/3 (GAUZE/BANDAGES/DRESSINGS) ×2 IMPLANT
BLADE CLIPPER SURG (BLADE) ×1 IMPLANT
CANISTER SUCT 3000ML PPV (MISCELLANEOUS) IMPLANT
CHLORAPREP W/TINT 26 (MISCELLANEOUS) ×2 IMPLANT
CLOSURE STERI-STRIP 1/4X4 (GAUZE/BANDAGES/DRESSINGS) ×1 IMPLANT
COVER SURGICAL LIGHT HANDLE (MISCELLANEOUS) ×2 IMPLANT
COVER WAND RF STERILE (DRAPES) ×1 IMPLANT
DRAIN PENROSE 1/2X12 LTX STRL (WOUND CARE) ×1 IMPLANT
DRAPE LAPAROTOMY TRNSV 102X78 (DRAPES) ×1 IMPLANT
DRSG TEGADERM 4X4.75 (GAUZE/BANDAGES/DRESSINGS) ×2 IMPLANT
ELECT REM PT RETURN 9FT ADLT (ELECTROSURGICAL) ×2
ELECTRODE REM PT RTRN 9FT ADLT (ELECTROSURGICAL) ×1 IMPLANT
GAUZE 4X4 16PLY RFD (DISPOSABLE) ×1 IMPLANT
GAUZE SPONGE 4X4 12PLY STRL (GAUZE/BANDAGES/DRESSINGS) ×2 IMPLANT
GLOVE BIO SURGEON STRL SZ 6 (GLOVE) ×2 IMPLANT
GLOVE INDICATOR 6.5 STRL GRN (GLOVE) ×2 IMPLANT
GOWN STRL REUS W/ TWL LRG LVL3 (GOWN DISPOSABLE) ×2 IMPLANT
GOWN STRL REUS W/TWL LRG LVL3 (GOWN DISPOSABLE) ×2
KIT BASIN OR (CUSTOM PROCEDURE TRAY) ×2 IMPLANT
KIT TURNOVER KIT B (KITS) ×2 IMPLANT
MESH ULTRAPRO 3X6 7.6X15CM (Mesh General) ×1 IMPLANT
NDL HYPO 25GX1X1/2 BEV (NEEDLE) ×1 IMPLANT
NEEDLE HYPO 25GX1X1/2 BEV (NEEDLE) ×2 IMPLANT
NS IRRIG 1000ML POUR BTL (IV SOLUTION) ×2 IMPLANT
PACK GENERAL/GYN (CUSTOM PROCEDURE TRAY) ×2 IMPLANT
PAD ARMBOARD 7.5X6 YLW CONV (MISCELLANEOUS) ×2 IMPLANT
PENCIL SMOKE EVACUATOR (MISCELLANEOUS) ×2 IMPLANT
STRIP CLOSURE SKIN 1/2X4 (GAUZE/BANDAGES/DRESSINGS) ×2 IMPLANT
SUT ETHIBOND 0 MO6 C/R (SUTURE) ×2 IMPLANT
SUT MNCRL AB 4-0 PS2 18 (SUTURE) ×2 IMPLANT
SUT SILK 3 0 (SUTURE)
SUT SILK 3-0 18XBRD TIE 12 (SUTURE) IMPLANT
SUT VIC AB 0 CT2 27 (SUTURE) ×2 IMPLANT
SUT VIC AB 2-0 SH 27 (SUTURE) ×1
SUT VIC AB 2-0 SH 27X BRD (SUTURE) ×1 IMPLANT
SUT VIC AB 3-0 SH 27 (SUTURE) ×1
SUT VIC AB 3-0 SH 27XBRD (SUTURE) ×1 IMPLANT
SUT VICRYL AB 3 0 TIES (SUTURE) ×2 IMPLANT
SYR CONTROL 10ML LL (SYRINGE) ×2 IMPLANT
TOWEL GREEN STERILE (TOWEL DISPOSABLE) ×2 IMPLANT
TOWEL GREEN STERILE FF (TOWEL DISPOSABLE) ×2 IMPLANT
TRAY FOLEY W/BAG SLVR 16FR (SET/KITS/TRAYS/PACK) ×1
TRAY FOLEY W/BAG SLVR 16FR ST (SET/KITS/TRAYS/PACK) IMPLANT

## 2019-07-16 NOTE — Op Note (Signed)
Operative Note  TANE CHOY  [id number]  [id number]  07/16/2019   Surgeon: Clovis Riley MD FACS   Assistant: none   Procedure performed: Open inguinal hernia repair with UltraPro mesh: right indirct + direct   Preop diagnosis:  right inguinal hernia   Post-op diagnosis/intraop findings: small indirect and small direct right inguinal hernia, minimal cord lipoma   Specimens: none   EBL: 5cc   Complications: none   Description of procedure: After obtaining informed consent, the patient was taken to the operating room and placed supine on operating room table where general anesthesia was initiated, preoperative antibiotics were administered, SCDs applied, and a formal timeout was performed. Foley catheter inserted which is removed at the end of the case. The groin was clipped, prepped and draped in the usual sterile fashion. An oblique incision was made in the just above the inguinal ligament after infiltrating the tissues with local anesthetic (Exparel mixed with 0.25% marcaine with epi). Soft tissues were dissected using electrocautery until the external oblique aponeurosis was encountered. This was divided sharply to expand the external ring. A plane was bluntly developed between the spermatic cord and the external oblique. The ilioinguinal nerve was identified, divided between hemostats and each end ligated with a 3-0 vicryl tie. The spermatic cord was then bluntly dissected away from the pubic tubercle and encircled with a Penrose. Inspection of the inguinal anatomy revealed a a small indirect sac and a small amount of protrusion in the direct. The indirect hernia sac was bluntly dissected away from the cord structures. Once we had affirmatively identified the sac, it was carefully opened in a region that was very thin. Inspection confirmed communication with the peritoneal cavity with no bowel contained currently. The sac was clamped and the excess excised at the level of the internal  ring, where the remaining sac was suture ligated with a 0 vicryl and reduced into the abdomen.  The direct inguinal hernia was reduced, and a figure of eight and one simple suture of 0 Vicryl was used to reapproximate the floor of the inguinal canal to flatten the surface and keep the hernia out of the field. A 3 x 6 piece of ultra Pro mesh was brought onto the field and trimmed to approximate the field. This was tacked to the pubic tubercle fascia using 0 ethibond. Interrupted 0 ethibonds were then used to tack the mesh to the inferior shelving edge and to the internal oblique superiorly. The tails of the mesh were wrapped around the spermatic cord, ensuring adequate room for the cord, and tacked to each other with 0 ethibond, and then directed laterally to lie flat. Hemostasis was ensured within the wound. The Penrose was removed. The external oblique aponeurosis was reapproximated with a running 3-0 Vicryl to re-create a narrowed external ring. More local was infiltrated around the pubic tubercle and in the plane just below the external oblique. The Scarpa's was reapproximated with interrupted 3-0 Vicryls. The skin was closed with a running subcuticular Monocryl. The remainder of the local was injected in the subcutaneous and subcuticular space. The field was then cleaned, benzoin and Steri-Strips and sterile bandage were applied. Both testicles were palpated in the scrotum at the end of the case. The patient was then awakened extubated and taken to PACU in stable condition.    All counts were correct at the completion of the case

## 2019-07-16 NOTE — Transfer of Care (Signed)
Immediate Anesthesia Transfer of Care Note  Patient: Donald Wright  Procedure(s) Performed: OPEN RIGHT INGUINAL HERNIA REPAIR WITH MESH (Right Inguinal)  Patient Location: PACU  Anesthesia Type:General  Level of Consciousness: awake, alert  and oriented  Airway & Oxygen Therapy: Patient Spontanous Breathing  Post-op Assessment: Report given to RN and Post -op Vital signs reviewed and stable  Post vital signs: Reviewed and stable  Last Vitals:  Vitals Value Taken Time  BP 135/81 07/16/19 0922  Temp    Pulse 92 07/16/19 0923  Resp 12 07/16/19 0923  SpO2 96 % 07/16/19 0923  Vitals shown include unvalidated device data.  Last Pain:  Vitals:   07/16/19 0608  TempSrc:   PainSc: 9       Patients Stated Pain Goal: 4 ([id number])  Complications: No apparent anesthesia complications

## 2019-07-16 NOTE — Anesthesia Postprocedure Evaluation (Signed)
Anesthesia Post Note  Patient: Donald Wright  Procedure(s) Performed: OPEN RIGHT INGUINAL HERNIA REPAIR WITH MESH (Right Inguinal)     Patient location during evaluation: PACU Anesthesia Type: General Level of consciousness: awake and alert Pain management: pain level controlled Vital Signs Assessment: post-procedure vital signs reviewed and stable Respiratory status: spontaneous breathing, nonlabored ventilation and respiratory function stable Cardiovascular status: blood pressure returned to baseline and stable Postop Assessment: no apparent nausea or vomiting Anesthetic complications: no    Last Vitals:  Vitals:   07/16/19 1030 07/16/19 1045  BP:  126/73  Pulse: 83   Resp: 18   Temp: (!) 36.2 C   SpO2: 96% 97%    Last Pain:  Vitals:   07/16/19 1025  TempSrc:   PainSc: 9                  Teran Knittle E Thadius Smisek

## 2019-07-16 NOTE — Discharge Instructions (Addendum)
HERNIA REPAIR: POST OP INSTRUCTIONS  ######################################################################  EAT Gradually transition to a high fiber diet with a fiber supplement over the next few weeks after discharge.  Start with a pureed / full liquid diet (see below)  WALK Walk an hour a day.  Control your pain to do that.    CONTROL PAIN Control pain so that you can walk, sleep, tolerate sneezing/coughing, and go up/down stairs.  HAVE A BOWEL MOVEMENT DAILY Keep your bowels regular to avoid problems.  OK to try a laxative to override constipation.  OK to use an antidairrheal to slow down diarrhea.  Call if not better after 2 tries  CALL IF YOU HAVE PROBLEMS/CONCERNS Call if you are still struggling despite following these instructions. Call if you have concerns not answered by these instructions  ######################################################################    1. DIET: Follow a light bland diet & liquids the first 24 hours after arrival home, such as soup, liquids, starches, etc.  Be sure to drink plenty of fluids.  Quickly advance to a usual solid diet within a few days.  Avoid fast food or heavy meals as your are more likely to get nauseated or have irregular bowels.  A low-sugar, high-fiber diet for the rest of your life is ideal.   2. Take your usually prescribed home medications unless otherwise directed.  3. PAIN CONTROL: a. Pain is best controlled by a usual combination of three different methods TOGETHER: i. Ice/Heat ii. Over the counter pain medication iii. Prescription pain medication b. Most patients will experience some swelling and bruising around the hernia(s) such as the bellybutton, groins, or old incisions.  Ice packs or heating pads (30-60 minutes up to 6 times a day) will help. Use ice for the first few days to help decrease swelling and bruising, then switch to heat to help relax tight/sore spots and speed recovery.  Some people prefer to use ice  alone, heat alone, alternating between ice & heat.  Experiment to what works for you.  Swelling and bruising can take several weeks to resolve.   c. It is helpful to take an over-the-counter pain medication regularly for the first few weeks.  Choose one of the following that works best for you: i. Naproxen (Aleve, etc)  Two 220mg  tabs twice a day ii. Ibuprofen (Advil, etc) Three 200mg  tabs four times a day (every meal & bedtime) iii. Acetaminophen (Tylenol, etc) 325-650mg  four times a day (every meal & bedtime) d. A  prescription for pain medication should be given to you upon discharge.  Take your pain medication as prescribed. You already have a narcotic prescription from your primary care doctor, so no additional narcotics will be prescribed. I have added a muscle relaxer and gabapentin to help with post-op pain.  i. If you are having problems/concerns with the prescription medicine (does not control pain, nausea, vomiting, rash, itching, etc), please call us [phone] to see if we need to switch you to a different pain medicine that will work better for you and/or control your side effect better. ii. If you need a refill on your pain medication, please contact your pharmacy.  They will contact our office to request authorization. Prescriptions will not be filled after 5 pm or on week-ends.  4. Avoid getting constipated.  Between the surgery and the pain medications, it is common to experience some constipation.  Increasing fluid intake and taking a fiber supplement (such as Metamucil, Citrucel, FiberCon, MiraLax, etc) 1-2 times a day regularly will usually help  prevent this problem from occurring.  A mild laxative (prune juice, Milk of Magnesia, MiraLax, etc) should be taken according to package directions if there are no bowel movements after 48 hours.    5. Wash / shower every day starting post-op day 2 (Sunday).  You may shower over the steri strips as they are waterproof.    6. Remove your  outer bandage on Sunday (2 days after surgery). Steri strips will peel off after 1-2 weeks. You may replace a dressing/Band-Aid to cover the incision for comfort if you wish. You may leave the incisions open to air.    7. ACTIVITIES as tolerated:   a. You may resume regular (light) daily activities beginning the next day--such as daily self-care, walking, climbing stairs--gradually increasing activities as tolerated.  Control your pain so that you can walk an hour a day.  If you can walk 30 minutes without difficulty, it is safe to try more intense activity such as jogging, treadmill, bicycling, low-impact aerobics, swimming, etc. b. Refrain from the most intensive and strenuous activity such as sit-ups, heavy lifting, contact sports, etc  Refrain from any heavy lifting or straining until 6 weeks after surgery.   c. DO NOT PUSH THROUGH PAIN.  Let pain be your guide: If it hurts to do something, don't do it.  Pain is your body warning you to avoid that activity for another week until the pain goes down. d. You may drive when you are no longer taking prescription pain medication, you can comfortably wear a seatbelt, and you can safely maneuver your car and apply brakes. e. Dennis Bast may have sexual intercourse when it is comfortable.   8. FOLLOW UP in our office a. Please call [facility] at [phone] to set up an appointment to see your surgeon in the office for a follow-up appointment approximately 3 weeks after your surgery. b. Make sure that you call for this appointment the day you arrive home to insure a convenient appointment time.  9.  If you have disability of FMLA / Family leave forms, please bring the forms to the office for processing.  (do not give to your surgeon).  WHEN TO CALL us [phone]: 1. Poor pain control 2. Reactions / problems with new medications (rash/itching, nausea, etc)  3. Fever over 101.5 F (38.5 C) 4. Inability to urinate 5. Nausea and/or vomiting 6. Worsening swelling  or bruising 7. Continued bleeding from incision. 8. Increased pain, redness, or drainage from the incision   The clinic staff is available to answer your questions during regular business hours (8:30am-5pm).  Please dont hesitate to call and ask to speak to one of our nurses for clinical concerns.   If you have a medical emergency, go to the nearest emergency room or call 911.  A surgeon from [facility] is always on call at the hospitals in [facility], [place], [place], [place], [place]  [address] ?  [address] [address], [place], [place]   [address] MAIN: [phone] ? TOLL FREE: [phone] ? FAX: [phone] [email or web address]

## 2019-07-16 NOTE — Anesthesia Procedure Notes (Signed)
Procedure Name: LMA Insertion Date/Time: 07/16/2019 7:37 AM Performed by: Marsa Aris, CRNA Pre-anesthesia Checklist: Patient identified, Emergency Drugs available, Suction available and Patient being monitored Patient Re-evaluated:Patient Re-evaluated prior to induction Oxygen Delivery Method: Circle System Utilized Preoxygenation: Pre-oxygenation with 100% oxygen Induction Type: IV induction Ventilation: Mask ventilation without difficulty LMA: LMA inserted LMA Size: 5.0 Number of attempts: 1 Airway Equipment and Method: Bite block Placement Confirmation: positive ETCO2 Tube secured with: Tape Dental Injury: Teeth and Oropharynx as per pre-operative assessment

## 2019-07-16 NOTE — Progress Notes (Signed)
I have spoken with Donald Wright' pharmacy and verified that he did fill a precription for Hydrocodone-Acetaminophen 10/325mg , #120 tabs, on 06/28/19.

## 2019-07-16 NOTE — Interval H&P Note (Signed)
History and Physical Interval Note:  07/16/2019 6:58 AM  Donald Wright  has presented today for surgery, with the diagnosis of RIGHT INGUINAL HERNIA.  The various methods of treatment have been discussed with the patient and family. After consideration of risks, benefits and other options for treatment, the patient has consented to  Procedure(s): OPEN RIGHT INGUINAL HERNIA REPAIR WITH MESH (Right) as a surgical intervention.  The patient's history has been reviewed, patient examined, no change in status, stable for surgery.  I have reviewed the patient's chart and labs.  Questions were answered to the patient's satisfaction.     Shyla Gayheart Rich Brave

## 2019-07-20 ENCOUNTER — Encounter (HOSPITAL_COMMUNITY): Payer: Self-pay | Admitting: Surgery

## 2019-11-30 ENCOUNTER — Ambulatory Visit (INDEPENDENT_AMBULATORY_CARE_PROVIDER_SITE_OTHER): Payer: Medicare Other | Admitting: Internal Medicine

## 2019-11-30 ENCOUNTER — Encounter: Payer: Self-pay | Admitting: Internal Medicine

## 2019-11-30 ENCOUNTER — Other Ambulatory Visit: Payer: Self-pay

## 2019-11-30 VITALS — BP 160/90 | HR 83 | Temp 97.1°F | Ht 69.0 in | Wt 191.5 lb

## 2019-11-30 DIAGNOSIS — M48061 Spinal stenosis, lumbar region without neurogenic claudication: Secondary | ICD-10-CM

## 2019-11-30 DIAGNOSIS — E119 Type 2 diabetes mellitus without complications: Secondary | ICD-10-CM | POA: Diagnosis not present

## 2019-11-30 DIAGNOSIS — E039 Hypothyroidism, unspecified: Secondary | ICD-10-CM

## 2019-11-30 DIAGNOSIS — Z79899 Other long term (current) drug therapy: Secondary | ICD-10-CM

## 2019-11-30 DIAGNOSIS — I10 Essential (primary) hypertension: Secondary | ICD-10-CM | POA: Diagnosis not present

## 2019-11-30 DIAGNOSIS — E559 Vitamin D deficiency, unspecified: Secondary | ICD-10-CM

## 2019-11-30 DIAGNOSIS — G4733 Obstructive sleep apnea (adult) (pediatric): Secondary | ICD-10-CM | POA: Diagnosis not present

## 2019-11-30 DIAGNOSIS — J329 Chronic sinusitis, unspecified: Secondary | ICD-10-CM

## 2019-11-30 DIAGNOSIS — E538 Deficiency of other specified B group vitamins: Secondary | ICD-10-CM

## 2019-11-30 MED ORDER — HYDROCODONE-ACETAMINOPHEN 5-325 MG PO TABS: 1.0000 | ORAL_TABLET | Freq: Every day | ORAL | 0 refills | Status: AC | PRN

## 2019-11-30 NOTE — Progress Notes (Signed)
New Patient Office Visit     This visit occurred during the SARS-CoV-2 public health emergency.  Safety protocols were in place, including screening questions prior to the visit, additional usage of staff PPE, and extensive cleaning of exam room while observing appropriate contact time as indicated for disinfecting solutions.    CC/Reason for Visit: Establish care, discuss chronic conditions Previous PCP: Apolonio Schneiders, MD Last Visit: About 4 months ago  HPI: Donald Wright is a 64 y.o. male who is coming in today for the above mentioned reasons. Past Medical History is significant for: Hypertension, not controlled on current medications which include lisinopril 40 mg and metoprolol XL 50 mg.  Type 2 diabetes diagnosed in 2018, without recent A1c measurement and not on any medications.  History of obstructive sleep apnea who is supposed to be on CPAP but has not been.  He has a history of chronic "sinus issues".  Wants to be immediately referred to ENT.  He also has a history of chronic low back pain due to spinal stenosis he was previously on hydrocodone and would like to resume today.  He moved from [facility], he is retired from [employer].  He smokes infrequently about 1 cigarette a week "when he is upset".  Drinks alcohol occasionally.  No known drug allergies, surgeries are only significant for a right inguinal hernia in August 2020.  His family history significant for mother who passed with congestive heart failure dad and a brother with cancer of unknown origins and another brother with "diabetes" with several toe amputations.   Past Medical/Surgical History: Past Medical History:  Diagnosis Date  . Arthritis    "groin" arthritis , back & R knee  . Bleeding from the nose    last one- 07/04/2019, pt. reports that he has been to ENT & he was told that there wasn't a problem so he said he would not pay her.   . Diabetes mellitus without complication ([place])   . Heart murmur     pt. thinks he had an ECHO many yrs. ago, doesn't have the results.   . Hypertension   . Sleep apnea    see notes under apnea     Past Surgical History:  Procedure Laterality Date  . INGUINAL HERNIA REPAIR Right 07/16/2019   Procedure: OPEN RIGHT INGUINAL HERNIA REPAIR WITH MESH;  Surgeon: Clovis Riley, MD;  Location: [facility];  Service: General;  Laterality: Right;  . NO PAST SURGERIES      Social History:  reports that he has been smoking cigarettes. He has never used smokeless tobacco. He reports current alcohol use of about 4.0 standard drinks of alcohol per week. He reports current drug use. Drugs: Marijuana and "Crack" cocaine.  Allergies: Allergies  Allergen Reactions  . Ibuprofen     N&V     Family History:  Family History  Problem Relation Age of Onset  . Heart failure Mother   . Cancer Father   . Cancer Brother   . Diabetes Brother      Current Outpatient Medications:  .  cholecalciferol (VITAMIN D3) 25 MCG (1000 UT) tablet, Take 1,000 Units by mouth daily., Disp: , Rfl:  .  GINSENG PO, Take 1 tablet by mouth daily., Disp: , Rfl:  .  lisinopril (ZESTRIL) 40 MG tablet, Take 40 mg by mouth daily., Disp: , Rfl:  .  metoprolol succinate (TOPROL-XL) 50 MG 24 hr tablet, Take 50 mg by mouth daily., Disp: , Rfl:  .  vitamin E 400 UNIT capsule, Take 400 Units by mouth daily., Disp: , Rfl:  .  HYDROcodone-acetaminophen (NORCO/VICODIN) 5-325 MG tablet, Take 1 tablet by mouth daily as needed for moderate pain or severe pain. FILL IN TWO MONTHS, Disp: 15 tablet, Rfl: 0 .  HYDROcodone-acetaminophen (NORCO/VICODIN) 5-325 MG tablet, Take 1 tablet by mouth daily as needed for severe pain. FILL IN ONE MONTH, Disp: 15 tablet, Rfl: 0 .  HYDROcodone-acetaminophen (NORCO/VICODIN) 5-325 MG tablet, Take 1 tablet by mouth daily as needed for moderate pain or severe pain., Disp: 15 tablet, Rfl: 0  Review of Systems:  Constitutional: Denies fever, chills, diaphoresis, appetite change  and fatigue.  HEENT: Denies photophobia, eye pain, redness, hearing loss, ear pain, congestion, sore throat, rhinorrhea, sneezing, mouth sores, trouble swallowing, neck pain, neck stiffness and tinnitus.   Respiratory: Denies SOB, DOE, cough, chest tightness,  and wheezing.   Cardiovascular: Denies chest pain, palpitations and leg swelling.  Gastrointestinal: Denies nausea, vomiting, abdominal pain, diarrhea, constipation, blood in stool and abdominal distention.  Genitourinary: Denies dysuria, urgency, frequency, hematuria, flank pain and difficulty urinating.  Endocrine: Denies: hot or cold intolerance, sweats, changes in hair or nails, polyuria, polydipsia. Musculoskeletal: Denies  joint swelling, arthralgias and gait problem.  Skin: Denies pallor, rash and wound.  Neurological: Denies dizziness, seizures, syncope, weakness, light-headedness, numbness and headaches.  Hematological: Denies adenopathy. Easy bruising, personal or family bleeding history  Psychiatric/Behavioral: Denies suicidal ideation, mood changes, confusion, nervousness, sleep disturbance and agitation    Physical Exam: Vitals:   11/30/19 0818  BP: (!) 160/90  Pulse: 83  Temp: (!) 97.1 F (36.2 C)  TempSrc: Temporal  SpO2: 98%  Weight: 191 lb 8 oz (86.9 kg)  Height: 5\' 9"  (1.753 m)   Body mass index is 28.28 kg/m.  Constitutional: NAD, calm, comfortable Eyes: PERRL, lids and conjunctivae normal ENMT: Mucous membranes are moist. Respiratory: clear to auscultation bilaterally, no wheezing, no crackles. Normal respiratory effort. No accessory muscle use.  Cardiovascular: Regular rate and rhythm, no murmurs / rubs / gallops. No extremity edema.  Neurologic: Grossly intact and nonfocal  Psychiatric: Normal judgment and insight. Alert and oriented x 3. Normal mood.    Impression and Plan:  Essential hypertension  -Uncontrolled, he states he was a little anxious about meeting new provider. -Have advised  ambulatory blood pressure monitoring and will return in 6 weeks for follow-up.  He knows if elevated will need to adjust antihypertensive therapy.  Type 2 diabetes mellitus without complication, without long-term current use of insulin (HCC)  - Plan: Hemoglobin A1c, Lipid panel -Suspect will need to start medication, Metformin is first-line therapy.  OSA (obstructive sleep apnea)  - Plan: Ambulatory referral to Pulmonology, needs CPAP machine  Spinal stenosis of lumbar region without neurogenic claudication High risk medication use   -NCCSRS reviewed in EPIC   Indication for chronic opioid:  Chronic back pain due to spinal stenosis and osteoarthritis Medication and dose:  Hydrocodone/APAP 5/325 mg to use once daily as needed # pills per month:  Number 15 tablets Last UDS date:  Unknown Opioid Treatment Agreement signed:  Yes Opioid Treatment Agreement last reviewed with patient:  11/30/2019 NCCSRS reviewed this encounter (include red flags):   Yes, no red flags identified, overdose risk score of 190.   Sinusitis, unspecified chronicity, unspecified location  - Plan: Ambulatory referral to ENT per patient request, does not want to try Flonase or antihistamines first.     Patient Instructions  -Nice seeing you today!!  -Return  fasting for lab work. We will let you know once results are available.  -Consider flu vaccination next visit.       Lelon Frohlich, MD [facility] ([facility])

## 2019-11-30 NOTE — Patient Instructions (Signed)
-  Nice seeing you today!!  -Return fasting for lab work. We will let you know once results are available.  -Consider flu vaccination next visit.

## 2019-12-06 ENCOUNTER — Other Ambulatory Visit: Payer: Medicare Other

## 2019-12-08 ENCOUNTER — Institutional Professional Consult (permissible substitution): Payer: Medicare Other | Admitting: Pulmonary Disease

## 2019-12-10 ENCOUNTER — Other Ambulatory Visit: Payer: Medicare Other

## 2019-12-20 ENCOUNTER — Other Ambulatory Visit: Payer: Medicare Other

## 2019-12-28 ENCOUNTER — Ambulatory Visit (INDEPENDENT_AMBULATORY_CARE_PROVIDER_SITE_OTHER): Payer: Medicare Other | Admitting: Otolaryngology

## 2019-12-31 ENCOUNTER — Other Ambulatory Visit: Payer: Self-pay | Admitting: Surgery

## 2019-12-31 DIAGNOSIS — R1031 Right lower quadrant pain: Secondary | ICD-10-CM

## 2020-01-10 ENCOUNTER — Institutional Professional Consult (permissible substitution): Payer: Medicare Other | Admitting: Pulmonary Disease

## 2020-01-12 ENCOUNTER — Ambulatory Visit: Payer: Medicare Other | Admitting: Internal Medicine

## 2020-01-17 ENCOUNTER — Other Ambulatory Visit: Payer: Medicare Other

## 2020-01-24 ENCOUNTER — Other Ambulatory Visit: Payer: Self-pay

## 2020-01-24 ENCOUNTER — Ambulatory Visit
Admission: RE | Admit: 2020-01-24 | Discharge: 2020-01-24 | Disposition: A | Discharge status: Home / Self Care | Payer: Medicare Other | Source: Ambulatory Visit | Attending: Surgery | Admitting: Surgery

## 2020-01-24 DIAGNOSIS — R1031 Right lower quadrant pain: Secondary | ICD-10-CM

## 2020-01-24 IMAGING — CT CT ABD-PELV W/ CM
1 of 3 series · 13 of 32 positions shown, 19 images · IV contrast (APPLIED)
Comparison: None.

CLINICAL DATA: Right inguinal pain status post hernia repair

EXAM:
CT ABDOMEN AND PELVIS WITH CONTRAST
TECHNIQUE: Multidetector CT imaging of the abdomen and pelvis was performed
using the standard protocol following bolus administration of
intravenous contrast.
CONTRAST:  100mL [8N] IOPAMIDOL ([8N]) INJECTION 61%

[Series 2: abd/pelvis w/cm · axial · 0.70mm/px · z∈[-386,+4]mm · 13 of 91 slices shown, 19 images]
[im 7/91  soft-tissue]
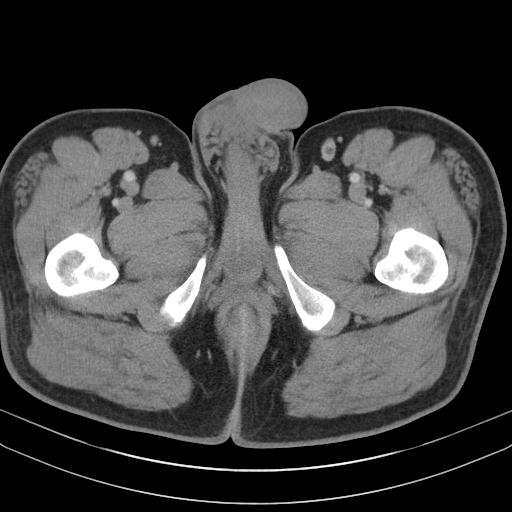
[im 7/91  bone]
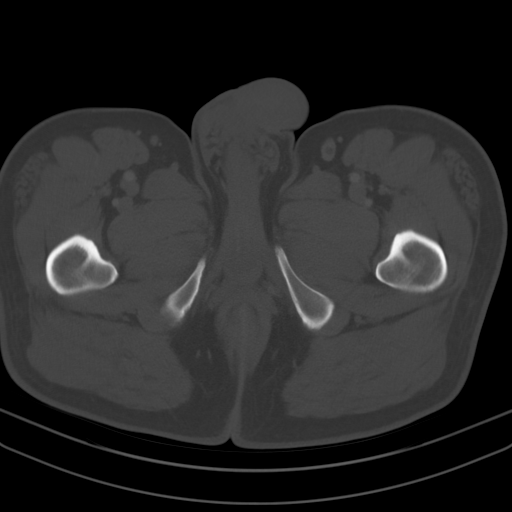
[im 13/91  soft-tissue]
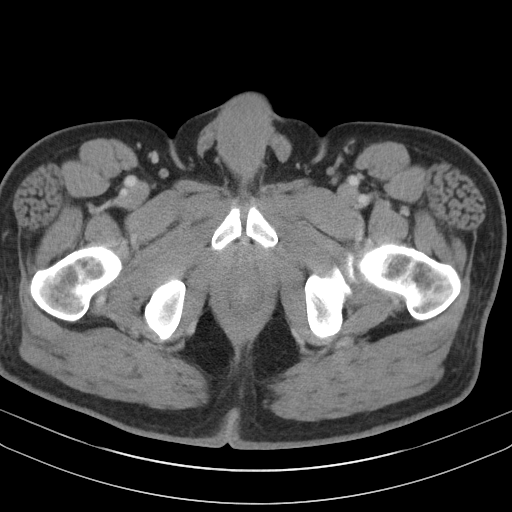
[im 19/91  soft-tissue]
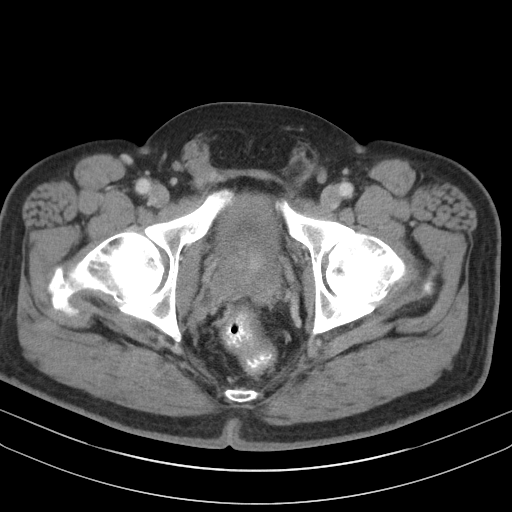
[im 25/91  soft-tissue]
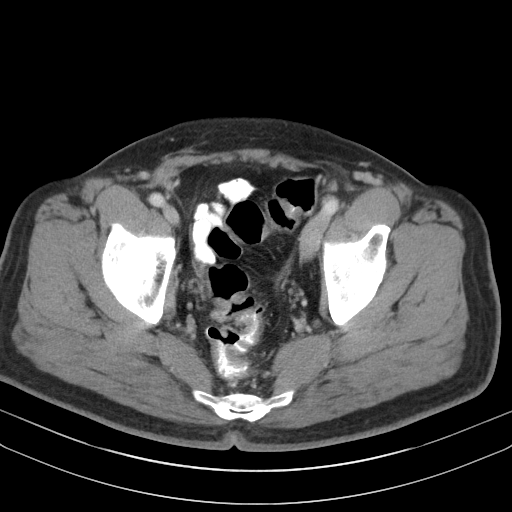
[im 31/91  soft-tissue]
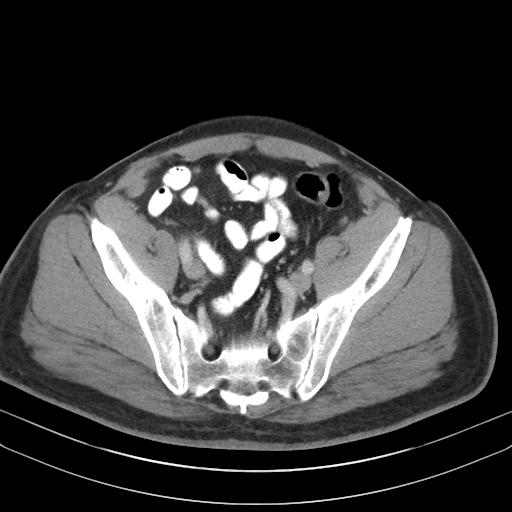
[im 37/91  soft-tissue]
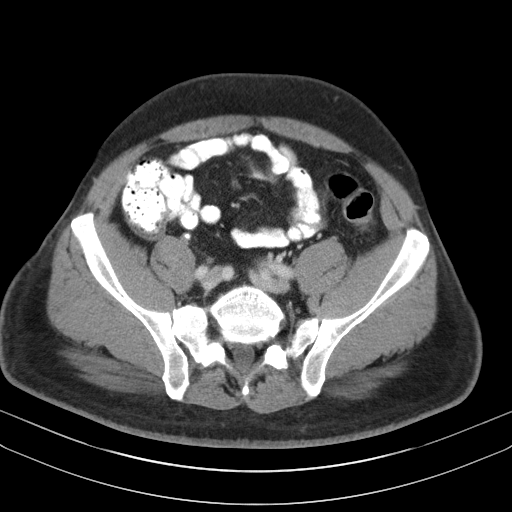
[im 49/91  soft-tissue]
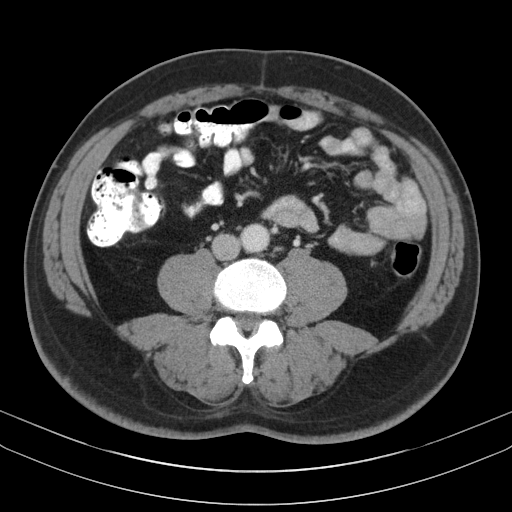
[im 55/91  soft-tissue]
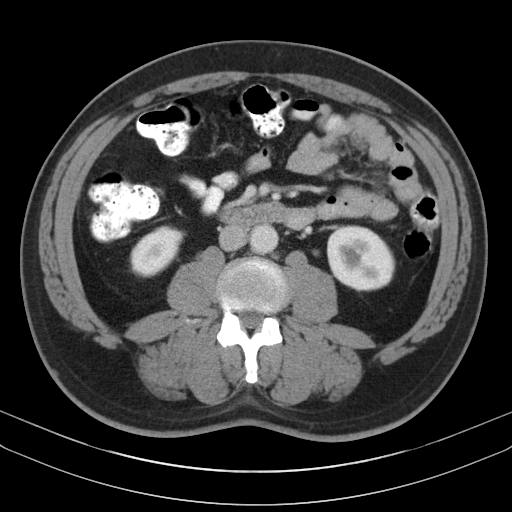
[im 61/91  soft-tissue]
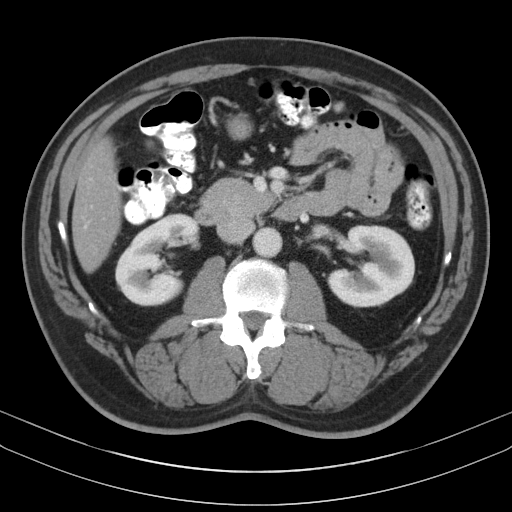
[im 61/91  bone]
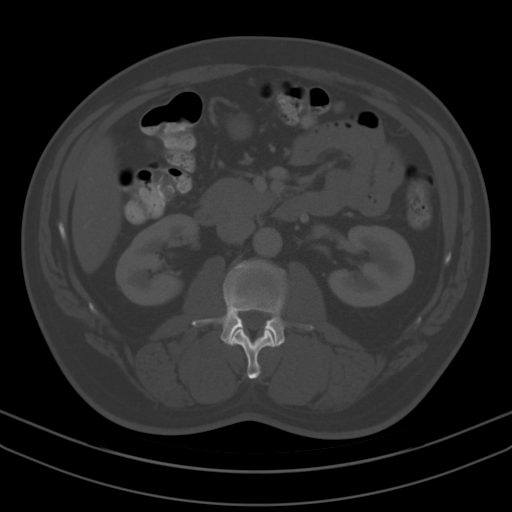
[im 67/91  soft-tissue]
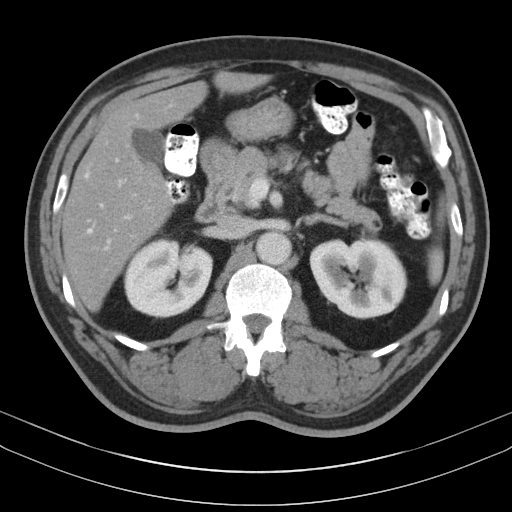
[im 67/91  lung]
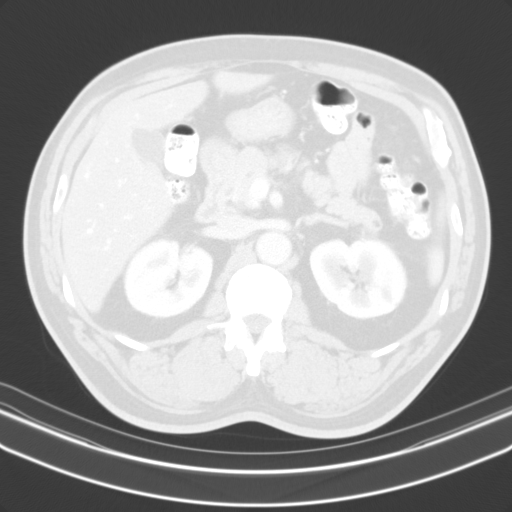
[im 73/91  soft-tissue]
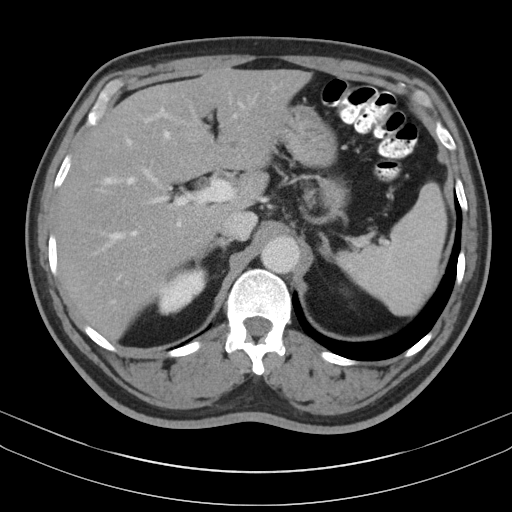
[im 73/91  lung]
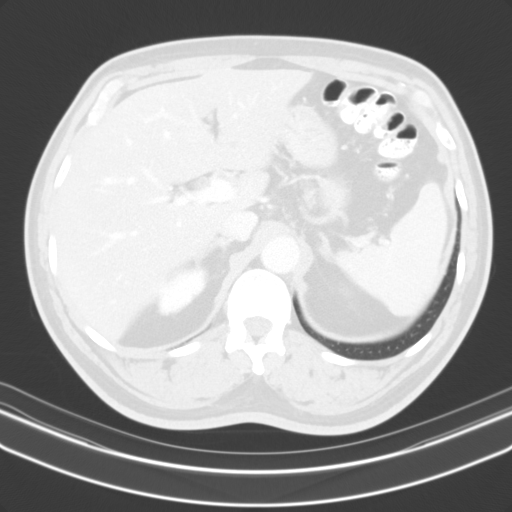
[im 79/91  soft-tissue]
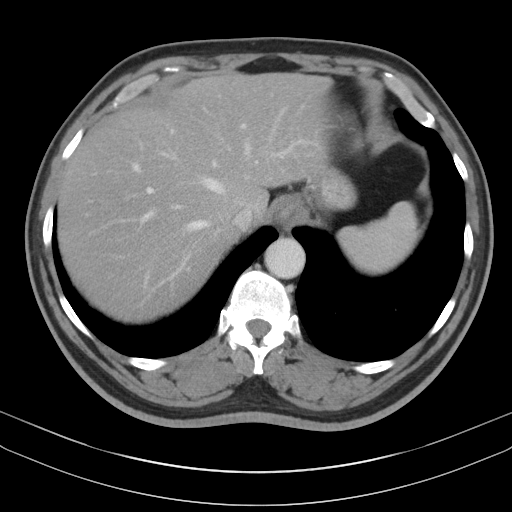
[im 79/91  lung]
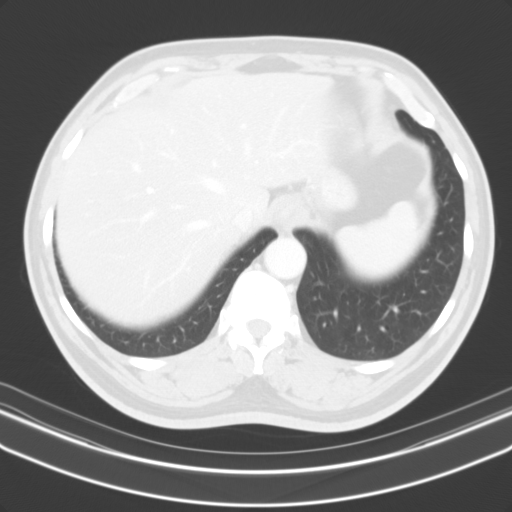
[im 85/91  soft-tissue]
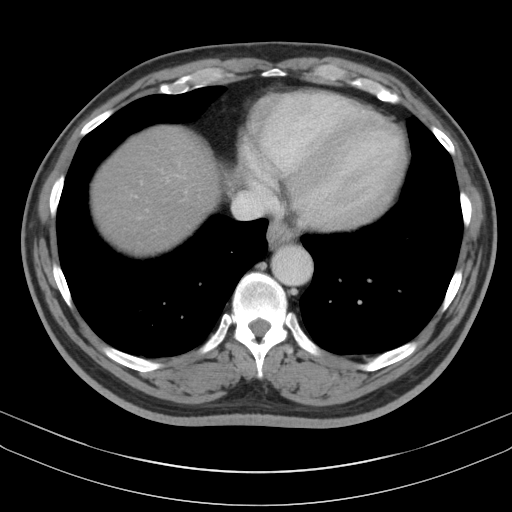
[im 85/91  lung]
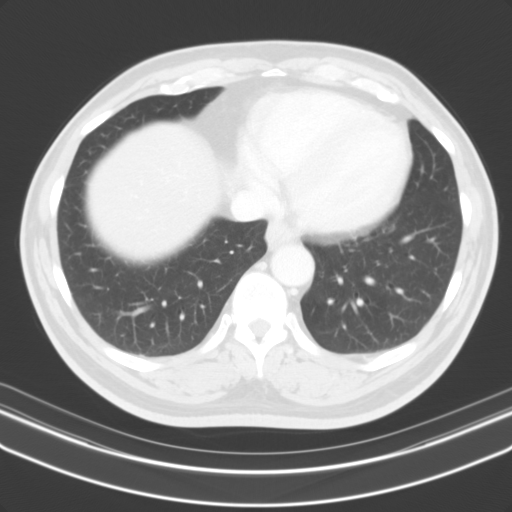

[13 of 32 positions shown; findings below may reference images not displayed]

FINDINGS: Lower chest: Lung bases are clear. No effusions. Heart is normal
size.

Hepatobiliary: Diffuse low-density throughout the liver compatible
with fatty infiltration. No focal abnormality. Gallbladder
unremarkable.

Pancreas: No focal abnormality or ductal dilatation.

Spleen: No focal abnormality.  Normal size.

Adrenals/Urinary Tract: No adrenal abnormality. No focal renal
abnormality. No stones or hydronephrosis. Urinary bladder is
unremarkable.

Stomach/Bowel: Normal appendix. Stomach, large and small bowel
grossly unremarkable.

Vascular/Lymphatic: No evidence of aneurysm or adenopathy.

Reproductive: Mild prominence of the prostate.

Other: Small left inguinal hernia containing fat. No evidence of
recurrent or residual right inguinal hernia. Small bilateral
inguinal lymph nodes, none pathologically enlarged.

Musculoskeletal: No acute bony abnormality. Degenerative facet
disease in the lower lumbar spine.
IMPRESSION: Small left inguinal hernia containing fat. No recurrent or residual
right inguinal hernia.

Mild diffuse fatty infiltration of the liver.

Mild prostate enlargement.

## 2020-01-24 MED ORDER — IOPAMIDOL (ISOVUE-300) INJECTION 61%
100.0000 mL | Freq: Once | INTRAVENOUS | Status: AC | PRN
  Administered 2020-01-24: 100 mL via INTRAVENOUS

## 2020-02-03 ENCOUNTER — Ambulatory Visit: Payer: Medicare Other | Attending: Surgery | Admitting: Physical Therapy

## 2020-02-18 ENCOUNTER — Ambulatory Visit: Payer: Medicare Other | Admitting: Internal Medicine

## 2020-03-03 ENCOUNTER — Other Ambulatory Visit: Payer: Self-pay

## 2020-03-03 ENCOUNTER — Ambulatory Visit (INDEPENDENT_AMBULATORY_CARE_PROVIDER_SITE_OTHER): Payer: Medicare Other

## 2020-03-03 ENCOUNTER — Other Ambulatory Visit: Payer: Self-pay | Admitting: Podiatry

## 2020-03-03 ENCOUNTER — Ambulatory Visit (INDEPENDENT_AMBULATORY_CARE_PROVIDER_SITE_OTHER): Payer: Medicare Other | Admitting: Podiatry

## 2020-03-03 DIAGNOSIS — M722 Plantar fascial fibromatosis: Secondary | ICD-10-CM

## 2020-03-03 DIAGNOSIS — M79671 Pain in right foot: Secondary | ICD-10-CM

## 2020-03-03 DIAGNOSIS — M216X9 Other acquired deformities of unspecified foot: Secondary | ICD-10-CM

## 2020-03-03 NOTE — Progress Notes (Signed)
  Subjective:  Patient ID: Donald Wright, male    DOB: 1956-02-22,  MRN: [id number]  Chief Complaint  Patient presents with  . Foot Pain    Pt states generalized right foot pain. Some is plantar heel, some lateral aspect of midfoot. 8 month duration, no known injuries.    64 y.o. male presents with the above complaint. History confirmed with patient.   Objective:  Physical Exam: warm, good capillary refill, no trophic changes or ulcerative lesions, normal DP and PT pulses and normal sensory exam. Right Foot: tenderness to palpation medial calcaneal tuber, no pain with calcaneal squeeze, decreased ankle joint ROM and +Silverskiold test normal exam, no swelling, tenderness, instability; ligaments intact, full range of motion of all ankle/foot joints other than findings noted above.  Radiographs: X-ray of the right foot: no evidence of calcaneal stress fracture  Assessment:   1. Plantar fasciitis   2. Equinus deformity of foot   3. Pain of right heel    Plan:  Patient was evaluated and treated and all questions answered.  Plantar Fasciitis -XR reviewed with patient -Educated patient on stretching and icing of the affected limb -Plantar fascial brace dispensed -Injection delivered to the plantar fascia of the right foot.  Procedure: Injection Tendon/Ligament Consent: Verbal consent obtained. Location: Right plantar fascia at the glabrous junction; medial approach. Skin Prep: Alcohol. Injectate: 1 cc 0.5% marcaine plain, 1 cc dexamethasone phosphate, 0.5 cc kenalog 10. Disposition: Patient tolerated procedure well. Injection site dressed with a band-aid.  No follow-ups on file.

## 2020-03-03 NOTE — Patient Instructions (Signed)

## 2020-03-24 ENCOUNTER — Ambulatory Visit (INDEPENDENT_AMBULATORY_CARE_PROVIDER_SITE_OTHER): Payer: Medicare Other | Admitting: Podiatry

## 2020-03-24 ENCOUNTER — Other Ambulatory Visit: Payer: Self-pay

## 2020-03-24 DIAGNOSIS — M722 Plantar fascial fibromatosis: Secondary | ICD-10-CM | POA: Diagnosis not present

## 2020-03-24 NOTE — Progress Notes (Signed)
  Subjective:  Patient ID: Donald Wright, male    DOB: [birth year],  MRN: [id number]  Chief Complaint  Patient presents with  . Follow-up    R heel pain. Pt stated, "Still aching. Some improvement with the brace at first". 8/10 pain - comes and goes, even if pt is inactive. Tried stretching.   64 y.o. male presents with the above complaint. History confirmed with patient.   Objective:  Physical Exam: warm, good capillary refill, no trophic changes or ulcerative lesions, normal DP and PT pulses and normal sensory exam. Right Foot: tenderness to palpation medial calcaneal tuber, no pain with calcaneal squeeze, decreased ankle joint ROM and +Silverskiold test normal exam, no swelling, tenderness, instability; ligaments intact, full range of motion of all ankle/foot joints other than findings noted above.  Assessment:   1. Plantar fasciitis    Plan:  Patient was evaluated and treated and all questions answered.   Plantar Fasciitis -Night splint dispensed -Injection delivered to the plantar fascia of the right foot.  Procedure: Injection Tendon/Ligament Consent: Verbal consent obtained. Location: Right plantar fascia at the glabrous junction; medial approach. Skin Prep: Alcohol. Injectate: 1 cc 0.5% marcaine plain, 1 cc dexamethasone phosphate, 0.5 cc kenalog 10. Disposition: Patient tolerated procedure well. Injection site dressed with a band-aid.     No follow-ups on file.
